# Patient Record
Sex: Male | Born: 1995 | Race: White | Hispanic: No | Marital: Single | Smoking: Former smoker
Health system: Southern US, Community
[De-identification: ages and names within clinical notes are randomized; demographics above are authoritative.]

## PROBLEM LIST (undated history)

## (undated) DIAGNOSIS — S82841A Displaced bimalleolar fracture of right lower leg, initial encounter for closed fracture: Secondary | ICD-10-CM

## (undated) DIAGNOSIS — I10 Essential (primary) hypertension: Secondary | ICD-10-CM

## (undated) DIAGNOSIS — T8859XA Other complications of anesthesia, initial encounter: Secondary | ICD-10-CM

## (undated) HISTORY — PX: NO PAST SURGERIES: SHX2092

---
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
## 2016-09-03 ENCOUNTER — Emergency Department
Admission: EM | Admit: 2016-09-03 | Discharge: 2016-09-03 | Disposition: A | Discharge status: Home / Self Care | Payer: BLUE CROSS/BLUE SHIELD | Attending: Emergency Medicine | Admitting: Emergency Medicine

## 2016-09-03 ENCOUNTER — Encounter: Payer: Self-pay | Admitting: Emergency Medicine

## 2016-09-03 DIAGNOSIS — W228XXA Striking against or struck by other objects, initial encounter: Secondary | ICD-10-CM | POA: Diagnosis not present

## 2016-09-03 DIAGNOSIS — Y929 Unspecified place or not applicable: Secondary | ICD-10-CM | POA: Insufficient documentation

## 2016-09-03 DIAGNOSIS — Y999 Unspecified external cause status: Secondary | ICD-10-CM | POA: Diagnosis not present

## 2016-09-03 DIAGNOSIS — Z23 Encounter for immunization: Secondary | ICD-10-CM | POA: Diagnosis not present

## 2016-09-03 DIAGNOSIS — F17200 Nicotine dependence, unspecified, uncomplicated: Secondary | ICD-10-CM | POA: Insufficient documentation

## 2016-09-03 DIAGNOSIS — Y9389 Activity, other specified: Secondary | ICD-10-CM | POA: Insufficient documentation

## 2016-09-03 DIAGNOSIS — S70352A Superficial foreign body, left thigh, initial encounter: Secondary | ICD-10-CM | POA: Insufficient documentation

## 2016-09-03 DIAGNOSIS — T148XXA Other injury of unspecified body region, initial encounter: Secondary | ICD-10-CM

## 2016-09-03 MED ORDER — CEPHALEXIN 500 MG PO CAPS: 500.0000 mg | ORAL_CAPSULE | Freq: Four times a day (QID) | ORAL | 0 refills | Status: AC

## 2016-09-03 MED ORDER — TETANUS-DIPHTH-ACELL PERTUSSIS 5-2.5-18.5 LF-MCG/0.5 IM SUSP
0.5000 mL | Freq: Once | INTRAMUSCULAR | Status: AC
  Administered 2016-09-03: 0.5 mL via INTRAMUSCULAR
  Filled 2016-09-03: qty 0.5

## 2016-09-03 MED ORDER — LIDOCAINE HCL (PF) 1 % IJ SOLN
INTRAMUSCULAR | Status: AC
  Filled 2016-09-03: qty 5

## 2016-09-03 MED ORDER — NAPROXEN 500 MG PO TBEC: 500.0000 mg | DELAYED_RELEASE_TABLET | Freq: Two times a day (BID) | ORAL | 0 refills | Status: AC

## 2016-09-03 MED ORDER — LIDOCAINE HCL 1 % IJ SOLN
5.0000 mL | Freq: Once | INTRAMUSCULAR | Status: DC
  Filled 2016-09-03: qty 5

## 2016-09-03 NOTE — ED Provider Notes (Signed)
[facility] Emergency Department Provider Note  ____________________________________________  Time seen: Approximately 3:29 PM  I have reviewed the triage vital signs and the nursing notes.   HISTORY  Chief Complaint Foreign Body in Skin    HPI Eric Durham is a 21 y.o. male with a foreign body consisting of sustained to the left anterior thigh. Patient states that he was helping a friend stack wood when he sustained a splinter. Patient did not hit his head or lose consciousness during the incident. He is unable to recall his last tetanus shot. He has been afebrile. He has noticed no streaking surrounding the foreign body. Patient denies chest pain, chest tightness, shortness of breath, abdominal pain, nausea and vomiting. No alleviating measures within attempted.    History reviewed. No pertinent past medical history.  There are no active problems to display for this patient.   History reviewed. No pertinent surgical history.  Prior to Admission medications   Medication Sig Start Date End Date Taking? Authorizing Provider  cephALEXin (KEFLEX) 500 MG capsule Take 1 capsule (500 mg total) by mouth 4 (four) times daily. 09/03/16 09/13/16  Orvil Feil, PA-C  naproxen (EC NAPROSYN) 500 MG EC tablet Take 1 tablet (500 mg total) by mouth 2 (two) times daily with a meal. 09/03/16 09/13/16  Orvil Feil, PA-C    Allergies Patient has no known allergies.  History reviewed. No pertinent family history.  Social History Social History  Substance Use Topics  . Smoking status: Current Every Day Smoker    Packs/day: 0.50  . Smokeless tobacco: Never Used  . Alcohol use No     Review of Systems  Constitutional: No fever/chills Eyes: No visual changes. No discharge ENT: No upper respiratory complaints. Cardiovascular: no chest pain. Respiratory: no cough. No SOB. Musculoskeletal: Negative for musculoskeletal pain. Skin: Patient has splinter of the skin  overlying left thigh. Neurological: Negative for headaches, focal weakness or numbness. ____________________________________________   PHYSICAL EXAM:  VITAL SIGNS: ED Triage Vitals  Enc Vitals Group     BP 09/03/16 1356 (!) 148/76     Pulse Rate 09/03/16 1356 63     Resp 09/03/16 1356 16     Temp 09/03/16 1356 97.8 F (36.6 C)     Temp src --      SpO2 09/03/16 1356 100 %     Weight 09/03/16 1357 149 lb 1 oz (67.6 kg)     Height 09/03/16 1357 6\' 1"  (1.854 m)     Head Circumference --      Peak Flow --      Pain Score 09/03/16 1359 3     Pain Loc --      Pain Edu? --      Excl. in GC? --    Constitutional: Alert and oriented. Well appearing and in no acute distress. Cardiovascular: Normal rate, regular rhythm. Normal S1 and S2.  Good peripheral circulation. Respiratory: Normal respiratory effort without tachypnea or retractions. Lungs CTAB. Good air entry to the bases with no decreased or absent breath sounds. Musculoskeletal: Full range of motion to all extremities. No gross deformities appreciated. Neurologic:  Normal speech and language. No gross focal neurologic deficits are appreciated.  Skin: Patient has splinter of the skin overlying the anterior left thigh. Psychiatric: Mood and affect are normal. Speech and behavior are normal. Patient exhibits appropriate insight and judgement. ____________________________________________   LABS (all labs ordered are listed, but only abnormal results are displayed)  Labs Reviewed -  No data to display ____________________________________________  EKG   ____________________________________________  RADIOLOGY   No results found.  ____________________________________________    PROCEDURES  Procedure(s) performed:    Procedures    Medications  lidocaine (XYLOCAINE) 1 % (with pres) injection 5 mL (not administered)  Tdap (BOOSTRIX) injection 0.5 mL (0.5 mLs Intramuscular Given 09/03/16 1555)   Foreign Body  Removal Performed by: Orvil FeilJaclyn M Edwardo Wojnarowski Consent: Verbal consent obtained. Risks and benefits: risks, benefits and alternatives were discussed Type: Splinter  Body area: Anterior Left Thigh   Anesthesia: local infiltration  Incision was made with a scalpel.  Local anesthetic: lidocaine 1% without epinephrine  Anesthetic total: 2 ml  Foreign Body: Trace Wood   Patient tolerance: Patient tolerated the procedure well with no immediate complications.  ____________________________________________   INITIAL IMPRESSION / ASSESSMENT AND PLAN / ED COURSE  Pertinent labs & imaging results that were available during my care of the patient were reviewed by me and considered in my medical decision making (see chart for details).  Review of the [place] CSRS was performed in accordance of the [facility] prior to dispensing any controlled drugs.     Assessment and Plan:  Splinter: Patient presents to the emergency department with a splinter of the skin overlying the left anterior thigh. Patient underwent splinter removal in the emergency department. Trace Wood material was extracted. Patient's tetanus status was updated. He was discharged with Keflex. All patient questions were answered. Vital signs are reassuring at this time. ____________________________________________  FINAL CLINICAL IMPRESSION(S) / ED DIAGNOSES  Final diagnoses:  Splinter      NEW MEDICATIONS STARTED DURING THIS VISIT:  New Prescriptions   CEPHALEXIN (KEFLEX) 500 MG CAPSULE    Take 1 capsule (500 mg total) by mouth 4 (four) times daily.   NAPROXEN (EC NAPROSYN) 500 MG EC TABLET    Take 1 tablet (500 mg total) by mouth 2 (two) times daily with a meal.        This chart was dictated using voice recognition software/Dragon. Despite best efforts to proofread, errors can occur which can change the meaning. Any change was purely unintentional.    Orvil FeilJaclyn M Sam Wunschel, PA-C 09/03/16 1556    Merrily BrittleNeil Rifenbark, MD 09/03/16  [phone]

## 2016-09-03 NOTE — ED Notes (Signed)
Splinter not visible but can be palpated under the skin approx 1" long

## 2016-09-03 NOTE — ED Triage Notes (Signed)
Pt was pulling a piece of wood across his leg and a long splinter went in that broke off as he attempted to take out.

## 2016-09-03 NOTE — ED Notes (Signed)
See triage note. He was pulling a piece of wood across left leg  Splinter went into left leg

## 2018-08-29 ENCOUNTER — Encounter (HOSPITAL_COMMUNITY): Payer: Self-pay

## 2018-08-29 ENCOUNTER — Other Ambulatory Visit: Payer: Self-pay

## 2018-08-29 ENCOUNTER — Emergency Department (HOSPITAL_COMMUNITY): Payer: BLUE CROSS/BLUE SHIELD

## 2018-08-29 ENCOUNTER — Emergency Department (HOSPITAL_COMMUNITY)
Admission: EM | Admit: 2018-08-29 | Discharge: 2018-08-29 | Disposition: A | Discharge status: Home / Self Care | Payer: BLUE CROSS/BLUE SHIELD | Attending: Emergency Medicine | Admitting: Emergency Medicine

## 2018-08-29 DIAGNOSIS — S6991XA Unspecified injury of right wrist, hand and finger(s), initial encounter: Secondary | ICD-10-CM | POA: Diagnosis present

## 2018-08-29 DIAGNOSIS — S6391XA Sprain of unspecified part of right wrist and hand, initial encounter: Secondary | ICD-10-CM | POA: Diagnosis not present

## 2018-08-29 DIAGNOSIS — F17210 Nicotine dependence, cigarettes, uncomplicated: Secondary | ICD-10-CM | POA: Insufficient documentation

## 2018-08-29 DIAGNOSIS — Y929 Unspecified place or not applicable: Secondary | ICD-10-CM | POA: Insufficient documentation

## 2018-08-29 DIAGNOSIS — Y999 Unspecified external cause status: Secondary | ICD-10-CM | POA: Diagnosis not present

## 2018-08-29 DIAGNOSIS — Y9351 Activity, roller skating (inline) and skateboarding: Secondary | ICD-10-CM | POA: Insufficient documentation

## 2018-08-29 DIAGNOSIS — W1789XA Other fall from one level to another, initial encounter: Secondary | ICD-10-CM | POA: Insufficient documentation

## 2018-08-29 DIAGNOSIS — S63501A Unspecified sprain of right wrist, initial encounter: Secondary | ICD-10-CM

## 2018-08-29 MED ORDER — IBUPROFEN 800 MG PO TABS: 800.0000 mg | ORAL_TABLET | Freq: Three times a day (TID) | ORAL | 0 refills | Status: DC

## 2018-08-29 NOTE — Discharge Instructions (Addendum)
Elevate and apply ice packs on and off to your wrist.  Wear the wrist brace as needed for support for at least 1 to 2 weeks.  Follow-up with 1 of the orthopedic providers listed if your symptoms are not improving.

## 2018-08-29 NOTE — ED Triage Notes (Signed)
Pt fell while skateboarding Saturday. Pt presents with pain to right hand and wrist today. Edema noted to right hand.

## 2018-08-29 NOTE — ED Provider Notes (Signed)
[facility] EMERGENCY DEPARTMENT Provider Note   CSN: [id number] Arrival date & time: 08/29/18  1255    History   Chief Complaint Chief Complaint  Patient presents with  . Wrist Pain    HPI Eric Durham is a 23 y.o. male.     HPI   Eric Durham is a 23 y.o. male who presents to the Emergency Department complaining of pain and swelling of his right lateral hand.  Symptoms began 2 days ago after a fall while riding a skateboard.  He is unsure of the mechanism of the fall.  He reports pain to his hand with movement of his fourth and fifth fingers.  Pain radiates to his wrist.  He denies open wound, numbness of his hand or fingers, elbow pain, head injury and neck pain.  No alleviating factors.    History reviewed. No pertinent past medical history.  There are no active problems to display for this patient.   History reviewed. No pertinent surgical history.    Home Medications    Prior to Admission medications   Not on File    Family History No family history on file.  Social History Social History   Tobacco Use  . Smoking status: Current Every Day Smoker    Packs/day: 0.50  . Smokeless tobacco: Never Used  Substance Use Topics  . Alcohol use: No  . Drug use: Not Currently     Allergies   Patient has no known allergies.   Review of Systems Review of Systems  Constitutional: Negative for chills and fever.  Musculoskeletal: Positive for arthralgias (Right hand pain and swelling) and joint swelling. Negative for neck pain.  Skin: Negative for color change and wound.  Neurological: Negative for dizziness, syncope and headaches.     Physical Exam Updated Vital Signs BP (!) 141/78 (BP Location: Right Arm)   Pulse 66   Temp 98.4 F (36.9 C) (Oral)   Resp 18   Ht 6\' 3"  (1.905 m)   Wt 72.6 kg   SpO2 100%   BMI 20.00 kg/m   Physical Exam Vitals signs and nursing note reviewed.  Constitutional:      General: He is not in acute  distress.    Appearance: He is well-developed.  HENT:     Head: Atraumatic.  Cardiovascular:     Rate and Rhythm: Normal rate and regular rhythm.     Pulses: Normal pulses.  Pulmonary:     Effort: Pulmonary effort is normal.     Breath sounds: Normal breath sounds.  Chest:     Chest wall: No tenderness.  Musculoskeletal:        General: Swelling, tenderness and signs of injury present. No deformity.     Comments: Focal tenderness to palpation of the dorsal aspect of the lateral right hand.  Mild to moderate edema noted.  Mild tenderness at the lateral wrist without bony deformity or edema of the wrist.  Proximal forearm and elbow are nontender.  Compartments are soft  Skin:    General: Skin is warm.     Capillary Refill: Capillary refill takes less than 2 seconds.     Findings: No rash.  Neurological:     General: No focal deficit present.     Mental Status: He is alert.     Sensory: No sensory deficit.     Motor: No weakness or abnormal muscle tone.      ED Treatments / Results  Labs (all labs ordered are listed,  but only abnormal results are displayed) Labs Reviewed - No data to display  EKG None  Radiology Dg Wrist Complete Right  Result Date: 08/29/2018 CLINICAL DATA:  Larey Seat while skateboarding. RIGHT hand and wrist pain. EXAM: RIGHT WRIST - COMPLETE 3+ VIEW; RIGHT HAND - COMPLETE 3+ VIEW COMPARISON:  None. FINDINGS: RIGHT hand: No acute fracture deformity or dislocation. No destructive bony lesions. Soft tissue planes are not suspicious. RIGHT wrist: No acute fracture deformity or dislocation. No destructive bony lesions. Dorsal wrist soft tissue swelling without subcutaneous gas or radiopaque foreign bodies. IMPRESSION: 1. Soft tissue swelling without acute osseous process. Electronically Signed   By: Awilda Metro M.D.   On: 08/29/2018 14:29   Dg Hand Complete Right  Result Date: 08/29/2018 CLINICAL DATA:  Larey Seat while skateboarding. RIGHT hand and wrist pain. EXAM:  RIGHT WRIST - COMPLETE 3+ VIEW; RIGHT HAND - COMPLETE 3+ VIEW COMPARISON:  None. FINDINGS: RIGHT hand: No acute fracture deformity or dislocation. No destructive bony lesions. Soft tissue planes are not suspicious. RIGHT wrist: No acute fracture deformity or dislocation. No destructive bony lesions. Dorsal wrist soft tissue swelling without subcutaneous gas or radiopaque foreign bodies. IMPRESSION: 1. Soft tissue swelling without acute osseous process. Electronically Signed   By: Awilda Metro M.D.   On: 08/29/2018 14:29    Procedures Procedures (including critical care time)  Medications Ordered in ED Medications - No data to display   Initial Impression / Assessment and Plan / ED Course  I have reviewed the triage vital signs and the nursing notes.  Pertinent labs & imaging results that were available during my care of the patient were reviewed by me and considered in my medical decision making (see chart for details).       Patient with right hand and wrist pain after mechanical fall.  Neurovascularly intact.  X-rays of the hand and wrist are negative for bony injury.  Injuries likely related to sprain.  Discussed possible ligamentous injury with patient.  He agrees to conservative treatment with Velcro splint of the wrist, elevation, ice, and NSAID therapy.  I have advised the need for orthopedic follow-up in 1 week if not improving.  Patient verbalized understanding.   Final Clinical Impressions(s) / ED Diagnoses   Final diagnoses:  Sprain of right wrist, initial encounter    ED Discharge Orders    None       Pauline Aus, PA-C 08/30/18 1419    Blane Ohara, MD 08/30/18 1623

## 2020-04-14 ENCOUNTER — Other Ambulatory Visit: Payer: Self-pay

## 2020-04-14 ENCOUNTER — Encounter (HOSPITAL_COMMUNITY): Payer: Self-pay

## 2020-04-14 ENCOUNTER — Emergency Department (HOSPITAL_COMMUNITY): Payer: Self-pay

## 2020-04-14 ENCOUNTER — Emergency Department (HOSPITAL_COMMUNITY)
Admission: EM | Admit: 2020-04-14 | Discharge: 2020-04-14 | Disposition: A | Discharge status: Home / Self Care | Payer: Self-pay | Attending: Emergency Medicine | Admitting: Emergency Medicine

## 2020-04-14 DIAGNOSIS — S8291XA Unspecified fracture of right lower leg, initial encounter for closed fracture: Secondary | ICD-10-CM | POA: Insufficient documentation

## 2020-04-14 DIAGNOSIS — Y9351 Activity, roller skating (inline) and skateboarding: Secondary | ICD-10-CM | POA: Insufficient documentation

## 2020-04-14 DIAGNOSIS — W19XXXA Unspecified fall, initial encounter: Secondary | ICD-10-CM

## 2020-04-14 DIAGNOSIS — Y92331 Roller skating rink as the place of occurrence of the external cause: Secondary | ICD-10-CM | POA: Insufficient documentation

## 2020-04-14 DIAGNOSIS — S82891A Other fracture of right lower leg, initial encounter for closed fracture: Secondary | ICD-10-CM

## 2020-04-14 DIAGNOSIS — Z87891 Personal history of nicotine dependence: Secondary | ICD-10-CM | POA: Insufficient documentation

## 2020-04-14 MED ORDER — KETOROLAC TROMETHAMINE 60 MG/2ML IM SOLN
60.0000 mg | Freq: Once | INTRAMUSCULAR | Status: AC
  Administered 2020-04-14: 60 mg via INTRAMUSCULAR
  Filled 2020-04-14: qty 2

## 2020-04-14 MED ORDER — IBUPROFEN 800 MG PO TABS: 800.0000 mg | ORAL_TABLET | Freq: Three times a day (TID) | ORAL | 0 refills | Status: DC

## 2020-04-14 NOTE — ED Notes (Signed)
Splint placed by ed tech and another RN, pt has positive sensation to touch in R foot, less than three sec cap refill.

## 2020-04-14 NOTE — ED Triage Notes (Signed)
Pt to er, pt states that he hurt his R ankle skate boarding yesterday, states that today it is more swollen and hurts more.

## 2020-04-14 NOTE — ED Provider Notes (Signed)
[facility] EMERGENCY DEPARTMENT Provider Note   CSN: [id number] Arrival date & time: 04/14/20  1051     History Chief Complaint  Patient presents with  . Ankle Pain    Eric Durham is a 24 y.o. male.  HPI 24 year old male with no significant medical history presents to the ER with right ankle pain.  Patient was skateboarding in a pool and has he went up the side of a pool fell onto his right ankle.  This happened yesterday, and he noted pain and worsening swelling this morning.  He is concerned that it might be broken.  He denies any numbness or tingling, no noticeable wounds to the leg.    History reviewed. No pertinent past medical history.  There are no problems to display for this patient.   History reviewed. No pertinent surgical history.     History reviewed. No pertinent family history.  Social History   Tobacco Use  . Smoking status: Former Smoker    Packs/day: 0.50    Types: Cigarettes    Quit date: 06/29/2017    Years since quitting: 2.7  . Smokeless tobacco: Never Used  Vaping Use  . Vaping Use: Every day  . Substances: Nicotine, Flavoring  Substance Use Topics  . Alcohol use: No  . Drug use: Not Currently    Home Medications Prior to Admission medications   Medication Sig Start Date End Date Taking? Authorizing Provider  ibuprofen (ADVIL) 800 MG tablet Take 1 tablet (800 mg total) by mouth 3 (three) times daily. 04/14/20   Mare Ferrari, PA-C    Allergies    Patient has no known allergies.  Review of Systems   Review of Systems  Constitutional: Negative for chills and fever.  Musculoskeletal: Positive for arthralgias and joint swelling.  Skin: Positive for color change. Negative for rash and wound.  Neurological: Negative for weakness and numbness.    Physical Exam Updated Vital Signs BP 124/72 (BP Location: Left Arm)   Pulse 92   Temp 98.2 F (36.8 C) (Oral)   Resp 18   Ht 6\' 3"  (1.905 m)   Wt 78.5 kg   SpO2 100%   BMI  21.62 kg/m   Physical Exam Vitals and nursing note reviewed.  Constitutional:      General: He is not in acute distress.    Appearance: Normal appearance. He is well-developed. He is not ill-appearing, toxic-appearing or diaphoretic.  HENT:     Head: Normocephalic and atraumatic.     Mouth/Throat:     Mouth: Mucous membranes are moist.     Pharynx: Oropharynx is clear.  Eyes:     Conjunctiva/sclera: Conjunctivae normal.  Cardiovascular:     Rate and Rhythm: Normal rate and regular rhythm.     Heart sounds: No murmur heard.   Pulmonary:     Effort: Pulmonary effort is normal. No respiratory distress.     Breath sounds: Normal breath sounds.  Abdominal:     Palpations: Abdomen is soft.     Tenderness: There is no abdominal tenderness.  Musculoskeletal:        General: Swelling, tenderness and signs of injury present. No deformity.     Cervical back: Normal range of motion and neck supple.     Right lower leg: Edema present.     Left lower leg: No edema.     Comments: Right ankle with significant edema and bruising. No evidence of open fractures. Gross sensation and strength intact. Limited ROM secondary to  pain   Skin:    General: Skin is warm and dry.     Capillary Refill: Capillary refill takes less than 2 seconds.     Findings: Bruising present. No erythema.  Neurological:     General: No focal deficit present.     Mental Status: He is alert and oriented to person, place, and time.  Psychiatric:        Mood and Affect: Mood normal.        Behavior: Behavior normal.     ED Results / Procedures / Treatments   Labs (all labs ordered are listed, but only abnormal results are displayed) Labs Reviewed - No data to display  EKG None  Radiology DG Ankle Right Port  Result Date: 04/14/2020 CLINICAL DATA:  Injury while skateboarding EXAM: PORTABLE RIGHT ANKLE - 3 VIEW COMPARISON:  None. FINDINGS: Frontal, oblique, and lateral views were obtained. There is a transversely  oriented fracture of the medial malleolus with inferior displacement of the avulsed medial malleolar fragment. There is a fracture, obliquely oriented, of the distal fibular diaphysis with slight posterior displacement distally. There is generalized soft tissue swelling. No appreciable joint effusion. No joint space narrowing or erosion. Ankle mortise appears grossly intact. IMPRESSION: Fracture of the medial malleolus, transversely oriented, with inferior displacement of the medial malleolus. There is also a fracture, obliquely oriented, of the distal fibula with slight posterior displacement distally. Soft tissue swelling noted. Ankle mortise appears grossly intact. No appreciable arthropathy. Electronically Signed   By: Bretta Bang III M.D.   On: 04/14/2020 11:47    Procedures Procedures (including critical care time)  Medications Ordered in ED Medications  ketorolac (TORADOL) injection 60 mg (60 mg Intramuscular Given 04/14/20 1320)    ED Course  I have reviewed the triage vital signs and the nursing notes.  Pertinent labs & imaging results that were available during my care of the patient were reviewed by me and considered in my medical decision making (see chart for details).    MDM Rules/Calculators/A&P                         24 year old male presents with right ankle bimalleolar fracture.  No evidence of open fracture, neurovascularly intact.  Patient was placed in a posterior and stirrup splint.  Toradol here for pain with crutches.  Spoke with Dr.Olin who recommends followup w/ Dr. Victorino Dike in office. Pt given followup instructions. Patient's report pain is not severe, will send home with 800mg  of ibuprofen. Return precautions discussed. He voiced understanding and is agreeable.   Final Clinical Impression(s) / ED Diagnoses Final diagnoses:  Fall  Closed fracture of right ankle, initial encounter    Rx / DC Orders ED Discharge Orders         Ordered    ibuprofen (ADVIL)  800 MG tablet  3 times daily        04/14/20 1315           04/16/20 04/14/20 1324    04/16/20, MD 04/14/20 1346

## 2020-04-14 NOTE — ED Notes (Signed)
Reviewed crutch use and splint care, pt verbalized understanding, pt states that he is ready to go home. Pt from dpt via wc, pt calling a friend to come get him

## 2020-04-14 NOTE — ED Notes (Signed)
Pt in bed, pt has swelling to R ankle, limb immobilized and elevated on pillow, ice pack placed,

## 2020-04-17 ENCOUNTER — Other Ambulatory Visit (HOSPITAL_COMMUNITY): Payer: Self-pay | Admitting: Orthopedic Surgery

## 2020-04-17 ENCOUNTER — Encounter (HOSPITAL_BASED_OUTPATIENT_CLINIC_OR_DEPARTMENT_OTHER): Payer: Self-pay | Admitting: Orthopedic Surgery

## 2020-04-17 ENCOUNTER — Other Ambulatory Visit: Payer: Self-pay

## 2020-04-17 ENCOUNTER — Other Ambulatory Visit (HOSPITAL_COMMUNITY)
Admission: RE | Admit: 2020-04-17 | Discharge: 2020-04-17 | Disposition: A | Discharge status: Home / Self Care | Payer: Self-pay | Source: Ambulatory Visit | Attending: Orthopedic Surgery | Admitting: Orthopedic Surgery

## 2020-04-17 DIAGNOSIS — Z01812 Encounter for preprocedural laboratory examination: Secondary | ICD-10-CM | POA: Insufficient documentation

## 2020-04-17 DIAGNOSIS — Z20822 Contact with and (suspected) exposure to covid-19: Secondary | ICD-10-CM | POA: Insufficient documentation

## 2020-04-17 LAB — SARS CORONAVIRUS 2 (TAT 6-24 HRS): SARS Coronavirus 2: NEGATIVE

## 2020-04-18 ENCOUNTER — Encounter (HOSPITAL_BASED_OUTPATIENT_CLINIC_OR_DEPARTMENT_OTHER): Payer: Self-pay | Admitting: Orthopedic Surgery

## 2020-04-18 ENCOUNTER — Other Ambulatory Visit: Payer: Self-pay

## 2020-04-18 ENCOUNTER — Encounter (HOSPITAL_BASED_OUTPATIENT_CLINIC_OR_DEPARTMENT_OTHER)
Admission: RE | Disposition: A | Discharge status: Home / Self Care | Payer: Self-pay | Source: Home / Self Care | Attending: Orthopedic Surgery

## 2020-04-18 ENCOUNTER — Ambulatory Visit (HOSPITAL_BASED_OUTPATIENT_CLINIC_OR_DEPARTMENT_OTHER)
Admission: RE | Admit: 2020-04-18 | Discharge: 2020-04-18 | Disposition: A | Discharge status: Home / Self Care | Payer: Self-pay | Attending: Orthopedic Surgery | Admitting: Orthopedic Surgery

## 2020-04-18 ENCOUNTER — Ambulatory Visit (HOSPITAL_BASED_OUTPATIENT_CLINIC_OR_DEPARTMENT_OTHER): Payer: Self-pay | Admitting: Anesthesiology

## 2020-04-18 DIAGNOSIS — S82851A Displaced trimalleolar fracture of right lower leg, initial encounter for closed fracture: Secondary | ICD-10-CM | POA: Insufficient documentation

## 2020-04-18 DIAGNOSIS — Y9351 Activity, roller skating (inline) and skateboarding: Secondary | ICD-10-CM | POA: Insufficient documentation

## 2020-04-18 HISTORY — DX: Displaced bimalleolar fracture of right lower leg, initial encounter for closed fracture: S82.841A

## 2020-04-18 HISTORY — PX: ORIF ANKLE FRACTURE: SHX5408

## 2020-04-18 HISTORY — DX: Other complications of anesthesia, initial encounter: T88.59XA

## 2020-04-18 SURGERY — OPEN REDUCTION INTERNAL FIXATION (ORIF) ANKLE FRACTURE
Anesthesia: General | Site: Ankle | Laterality: Right

## 2020-04-18 MED ORDER — MIDAZOLAM HCL 2 MG/2ML IJ SOLN
INTRAMUSCULAR | Status: AC
  Filled 2020-04-18: qty 2

## 2020-04-18 MED ORDER — LACTATED RINGERS IV SOLN: INTRAVENOUS | Status: DC

## 2020-04-18 MED ORDER — ROPIVACAINE HCL 5 MG/ML IJ SOLN
INTRAMUSCULAR | Status: DC | PRN
  Administered 2020-04-18: 50 mL via PERINEURAL

## 2020-04-18 MED ORDER — MIDAZOLAM HCL 2 MG/2ML IJ SOLN
INTRAMUSCULAR | Status: DC | PRN
  Administered 2020-04-18: 2 mg via INTRAVENOUS

## 2020-04-18 MED ORDER — PROPOFOL 10 MG/ML IV BOLUS
INTRAVENOUS | Status: DC | PRN
  Administered 2020-04-18: 300 mg via INTRAVENOUS

## 2020-04-18 MED ORDER — LIDOCAINE HCL (CARDIAC) PF 100 MG/5ML IV SOSY
PREFILLED_SYRINGE | INTRAVENOUS | Status: DC | PRN
  Administered 2020-04-18: 60 mg via INTRAVENOUS

## 2020-04-18 MED ORDER — LACTATED RINGERS IV SOLN: INTRAVENOUS | Status: DC | PRN

## 2020-04-18 MED ORDER — ONDANSETRON HCL 4 MG/2ML IJ SOLN
INTRAMUSCULAR | Status: AC
  Filled 2020-04-18: qty 2

## 2020-04-18 MED ORDER — OXYCODONE HCL 5 MG/5ML PO SOLN: 5.0000 mg | Freq: Once | ORAL | Status: DC | PRN

## 2020-04-18 MED ORDER — DEXAMETHASONE SODIUM PHOSPHATE 10 MG/ML IJ SOLN
INTRAMUSCULAR | Status: DC | PRN
  Administered 2020-04-18: 5 mg via INTRAVENOUS

## 2020-04-18 MED ORDER — PROPOFOL 10 MG/ML IV BOLUS
INTRAVENOUS | Status: AC
  Filled 2020-04-18: qty 40

## 2020-04-18 MED ORDER — HYDROMORPHONE HCL 1 MG/ML IJ SOLN: 0.2500 mg | INTRAMUSCULAR | Status: DC | PRN

## 2020-04-18 MED ORDER — FENTANYL CITRATE (PF) 100 MCG/2ML IJ SOLN
INTRAMUSCULAR | Status: AC
  Filled 2020-04-18: qty 2

## 2020-04-18 MED ORDER — MIDAZOLAM HCL 2 MG/2ML IJ SOLN
2.0000 mg | Freq: Once | INTRAMUSCULAR | Status: AC
  Administered 2020-04-18: 2 mg via INTRAVENOUS

## 2020-04-18 MED ORDER — SODIUM CHLORIDE 0.9 % IV SOLN: INTRAVENOUS | Status: DC

## 2020-04-18 MED ORDER — OXYCODONE HCL 5 MG PO TABS: 5.0000 mg | ORAL_TABLET | Freq: Once | ORAL | Status: DC | PRN

## 2020-04-18 MED ORDER — LIDOCAINE 2% (20 MG/ML) 5 ML SYRINGE
INTRAMUSCULAR | Status: AC
  Filled 2020-04-18: qty 5

## 2020-04-18 MED ORDER — PROMETHAZINE HCL 25 MG/ML IJ SOLN: 6.2500 mg | INTRAMUSCULAR | Status: DC | PRN

## 2020-04-18 MED ORDER — GLYCOPYRROLATE 0.2 MG/ML IJ SOLN
INTRAMUSCULAR | Status: DC | PRN
  Administered 2020-04-18: .2 mg via INTRAVENOUS

## 2020-04-18 MED ORDER — FENTANYL CITRATE (PF) 100 MCG/2ML IJ SOLN
100.0000 ug | Freq: Once | INTRAMUSCULAR | Status: AC
  Administered 2020-04-18: 100 ug via INTRAVENOUS

## 2020-04-18 MED ORDER — CEFAZOLIN SODIUM-DEXTROSE 2-3 GM-%(50ML) IV SOLR
INTRAVENOUS | Status: DC | PRN
  Administered 2020-04-18: 2 g via INTRAVENOUS

## 2020-04-18 MED ORDER — OXYCODONE HCL 5 MG PO TABS
5.0000 mg | ORAL_TABLET | Freq: Four times a day (QID) | ORAL | 0 refills | Status: AC | PRN
Start: 2020-04-18 — End: 2020-04-23

## 2020-04-18 MED ORDER — DEXMEDETOMIDINE HCL 200 MCG/2ML IV SOLN
INTRAVENOUS | Status: DC | PRN
  Administered 2020-04-18 (×2): 8 ug via INTRAVENOUS

## 2020-04-18 MED ORDER — AMISULPRIDE (ANTIEMETIC) 5 MG/2ML IV SOLN: 10.0000 mg | Freq: Once | INTRAVENOUS | Status: DC | PRN

## 2020-04-18 MED ORDER — ONDANSETRON HCL 4 MG/2ML IJ SOLN
INTRAMUSCULAR | Status: DC | PRN
  Administered 2020-04-18: 4 mg via INTRAVENOUS

## 2020-04-18 MED ORDER — MEPERIDINE HCL 25 MG/ML IJ SOLN: 6.2500 mg | INTRAMUSCULAR | Status: DC | PRN

## 2020-04-18 MED ORDER — VANCOMYCIN HCL 500 MG IV SOLR
INTRAVENOUS | Status: AC
  Filled 2020-04-18: qty 500

## 2020-04-18 MED ORDER — CEFAZOLIN SODIUM-DEXTROSE 2-4 GM/100ML-% IV SOLN: 2.0000 g | INTRAVENOUS | Status: DC

## 2020-04-18 MED ORDER — EPHEDRINE SULFATE 50 MG/ML IJ SOLN
INTRAMUSCULAR | Status: DC | PRN
  Administered 2020-04-18: 15 mg via INTRAVENOUS

## 2020-04-18 SURGICAL SUPPLY — 80 items
APL PRP STRL LF DISP 70% ISPRP (MISCELLANEOUS) ×1
BANDAGE ESMARK 6X9 LF (GAUZE/BANDAGES/DRESSINGS) IMPLANT
BIT DRILL 2.5X2.75 QC CALB (BIT) ×2 IMPLANT
BIT DRILL 2.9 CANN QC NONSTRL (BIT) ×2 IMPLANT
BIT DRILL 3.5X5.5 QC CALB (BIT) ×2 IMPLANT
BLADE SURG 15 STRL LF DISP TIS (BLADE) ×2 IMPLANT
BLADE SURG 15 STRL SS (BLADE) ×6
BNDG CMPR 9X4 STRL LF SNTH (GAUZE/BANDAGES/DRESSINGS)
BNDG CMPR 9X6 STRL LF SNTH (GAUZE/BANDAGES/DRESSINGS)
BNDG COHESIVE 4X5 TAN STRL (GAUZE/BANDAGES/DRESSINGS) ×3 IMPLANT
BNDG COHESIVE 6X5 TAN STRL LF (GAUZE/BANDAGES/DRESSINGS) ×3 IMPLANT
BNDG ESMARK 4X9 LF (GAUZE/BANDAGES/DRESSINGS) IMPLANT
BNDG ESMARK 6X9 LF (GAUZE/BANDAGES/DRESSINGS)
CANISTER SUCT 1200ML W/VALVE (MISCELLANEOUS) ×3 IMPLANT
CHLORAPREP W/TINT 26 (MISCELLANEOUS) ×3 IMPLANT
COVER BACK TABLE 60X90IN (DRAPES) ×3 IMPLANT
COVER WAND RF STERILE (DRAPES) IMPLANT
CUFF TOURN SGL QUICK 34 (TOURNIQUET CUFF)
CUFF TRNQT CYL 34X4.125X (TOURNIQUET CUFF) IMPLANT
DECANTER SPIKE VIAL GLASS SM (MISCELLANEOUS) IMPLANT
DRAPE EXTREMITY T 121X128X90 (DISPOSABLE) ×3 IMPLANT
DRAPE OEC MINIVIEW 54X84 (DRAPES) ×3 IMPLANT
DRAPE U-SHAPE 47X51 STRL (DRAPES) ×3 IMPLANT
DRSG MEPITEL 4X7.2 (GAUZE/BANDAGES/DRESSINGS) ×3 IMPLANT
DRSG PAD ABDOMINAL 8X10 ST (GAUZE/BANDAGES/DRESSINGS) ×6 IMPLANT
ELECT REM PT RETURN 9FT ADLT (ELECTROSURGICAL) ×3
ELECTRODE REM PT RTRN 9FT ADLT (ELECTROSURGICAL) ×1 IMPLANT
GAUZE SPONGE 4X4 12PLY STRL (GAUZE/BANDAGES/DRESSINGS) ×3 IMPLANT
GLOVE BIO SURGEON STRL SZ8 (GLOVE) ×3 IMPLANT
GLOVE BIOGEL PI IND STRL 8 (GLOVE) ×2 IMPLANT
GLOVE BIOGEL PI INDICATOR 8 (GLOVE) ×4
GLOVE ECLIPSE 8.0 STRL XLNG CF (GLOVE) ×3 IMPLANT
GOWN STRL REUS W/ TWL LRG LVL3 (GOWN DISPOSABLE) ×1 IMPLANT
GOWN STRL REUS W/ TWL XL LVL3 (GOWN DISPOSABLE) ×2 IMPLANT
GOWN STRL REUS W/TWL LRG LVL3 (GOWN DISPOSABLE) ×3
GOWN STRL REUS W/TWL XL LVL3 (GOWN DISPOSABLE) ×6
K-WIRE ACE 1.6X6 (WIRE) ×6
KWIRE ACE 1.6X6 (WIRE) IMPLANT
NEEDLE HYPO 22GX1.5 SAFETY (NEEDLE) IMPLANT
NS IRRIG 1000ML POUR BTL (IV SOLUTION) ×3 IMPLANT
PACK BASIN DAY SURGERY FS (CUSTOM PROCEDURE TRAY) ×3 IMPLANT
PAD CAST 4YDX4 CTTN HI CHSV (CAST SUPPLIES) ×1 IMPLANT
PADDING CAST ABS 4INX4YD NS (CAST SUPPLIES)
PADDING CAST ABS COTTON 4X4 ST (CAST SUPPLIES) IMPLANT
PADDING CAST COTTON 4X4 STRL (CAST SUPPLIES) ×3
PADDING CAST COTTON 6X4 STRL (CAST SUPPLIES) ×3 IMPLANT
PENCIL SMOKE EVACUATOR (MISCELLANEOUS) ×3 IMPLANT
PLATE ACE 100DEG 7HOLE (Plate) ×2 IMPLANT
SANITIZER HAND PURELL 535ML FO (MISCELLANEOUS) ×3 IMPLANT
SCREW ACE CAN 4.0 40M (Screw) ×2 IMPLANT
SCREW CORTICAL 3.5MM  12MM (Screw) ×3 IMPLANT
SCREW CORTICAL 3.5MM  16MM (Screw) ×6 IMPLANT
SCREW CORTICAL 3.5MM  30MM (Screw) ×3 IMPLANT
SCREW CORTICAL 3.5MM 12MM (Screw) IMPLANT
SCREW CORTICAL 3.5MM 14MM (Screw) ×2 IMPLANT
SCREW CORTICAL 3.5MM 16MM (Screw) IMPLANT
SCREW CORTICAL 3.5MM 18MM (Screw) ×4 IMPLANT
SCREW CORTICAL 3.5MM 30MM (Screw) IMPLANT
SHEET MEDIUM DRAPE 40X70 STRL (DRAPES) ×3 IMPLANT
SLEEVE SCD COMPRESS KNEE MED (MISCELLANEOUS) ×3 IMPLANT
SPLINT FAST PLASTER 5X30 (CAST SUPPLIES) ×40
SPLINT PLASTER CAST FAST 5X30 (CAST SUPPLIES) ×20 IMPLANT
SPONGE LAP 18X18 RF (DISPOSABLE) ×3 IMPLANT
STOCKINETTE 6  STRL (DRAPES) ×3
STOCKINETTE 6 STRL (DRAPES) ×1 IMPLANT
SUCTION FRAZIER HANDLE 10FR (MISCELLANEOUS) ×3
SUCTION TUBE FRAZIER 10FR DISP (MISCELLANEOUS) ×1 IMPLANT
SUT ETHILON 3 0 PS 1 (SUTURE) ×3 IMPLANT
SUT FIBERWIRE #2 38 T-5 BLUE (SUTURE)
SUT MNCRL AB 3-0 PS2 18 (SUTURE) IMPLANT
SUT VIC AB 0 SH 27 (SUTURE) IMPLANT
SUT VIC AB 2-0 SH 27 (SUTURE) ×3
SUT VIC AB 2-0 SH 27XBRD (SUTURE) ×1 IMPLANT
SUTURE FIBERWR #2 38 T-5 BLUE (SUTURE) IMPLANT
SYR BULB EAR ULCER 3OZ GRN STR (SYRINGE) ×3 IMPLANT
SYR CONTROL 10ML LL (SYRINGE) IMPLANT
TOWEL GREEN STERILE FF (TOWEL DISPOSABLE) ×6 IMPLANT
TUBE CONNECTING 20'X1/4 (TUBING) ×1
TUBE CONNECTING 20X1/4 (TUBING) ×2 IMPLANT
UNDERPAD 30X36 HEAVY ABSORB (UNDERPADS AND DIAPERS) ×3 IMPLANT

## 2020-04-18 NOTE — Anesthesia Procedure Notes (Signed)
Procedure Name: LMA Insertion Performed by: Keiran Sias M, CRNA Pre-anesthesia Checklist: Patient identified, Emergency Drugs available, Suction available and Patient being monitored Patient Re-evaluated:Patient Re-evaluated prior to induction Oxygen Delivery Method: Circle system utilized Preoxygenation: Pre-oxygenation with 100% oxygen Induction Type: IV induction Ventilation: Mask ventilation without difficulty LMA: LMA inserted LMA Size: 4.0 Number of attempts: 1 Airway Equipment and Method: Bite block Placement Confirmation: positive ETCO2 Tube secured with: Tape Dental Injury: Teeth and Oropharynx as per pre-operative assessment        

## 2020-04-18 NOTE — Anesthesia Procedure Notes (Signed)
Anesthesia Regional Block: Adductor canal block   Pre-Anesthetic Checklist: ,, timeout performed, Correct Patient, Correct Site, Correct Laterality, Correct Procedure, Correct Position, site marked, Risks and benefits discussed,  Surgical consent,  Pre-op evaluation,  At surgeon's request and post-op pain management  Laterality: Right  Prep: chloraprep       Needles:  Injection technique: Single-shot  Needle Type: Stimiplex     Needle Length: 9cm  Needle Gauge: 21     Additional Needles:   Procedures:,,,, ultrasound used (permanent image in chart),,,,  Narrative:  Start time: 04/18/2020 12:37 PM End time: 04/18/2020 12:42 PM Injection made incrementally with aspirations every 5 mL.  Performed by: Personally  Anesthesiologist: Lowella Curb, MD

## 2020-04-18 NOTE — Op Note (Signed)
04/18/2020  3:08 PM  PATIENT:  Eric Durham  24 y.o. male  PRE-OPERATIVE DIAGNOSIS: Right ankle trimalleolar fracture  POST-OPERATIVE DIAGNOSIS: Same   Procedure(s):  1.  Open treatment of right ankle trimalleolar fracture with internal fixation without fixation of the posterior lip 2.  Stress examination of the right ankle under fluoroscopy 3.  AP, mortise and lateral radiographs of the right ankle  SURGEON:  Toni Arthurs, MD  ASSISTANT: None  ANESTHESIA:   General, regional  EBL:  minimal   TOURNIQUET:   Total Tourniquet Time Documented: Thigh (Right) - 44 minutes Total: Thigh (Right) - 44 minutes  COMPLICATIONS:  None apparent  DISPOSITION:  Extubated, awake and stable to recovery.  INDICATION FOR PROCEDURE: The patient is a 24 year old male without significant past medical history.  He was skateboarding on Saturday when he fell and broke his ankle.  Radiographs reveal a displaced trimalleolar fracture.  He presents now for operative treatment of this displaced and unstable right ankle fracture.  The risks and benefits of the alternative treatment options have been discussed in detail.  The patient wishes to proceed with surgery and specifically understands risks of bleeding, infection, nerve damage, blood clots, need for additional surgery, amputation and death.  PROCEDURE IN DETAIL:  After pre operative consent was obtained, and the correct operative site was identified, the patient was brought to the operating room and placed supine on the OR table.  Anesthesia was administered.  Pre-operative antibiotics were administered.  A surgical timeout was taken.  The right lower extremity was prepped and draped in standard sterile fashion with a tourniquet around the thigh.  The extremity was elevated and the tourniquet was inflated to 250 mmHg.  A longitudinal incision was made over the lateral malleolus.  Dissection was carried down through the subcutaneous tissues.  Fracture  was cleaned of all hematoma and irrigated.  The fracture was reduced and held with a lobster claw clamp.  A 3.5 mm fully threaded lag screw was inserted from anterior to posterior across the fracture site.  It was noted to compress the fracture site appropriately and have excellent purchase.  A 7 hole one third tubular plate from the [employer] titanium small frag set was then contoured to fit the lateral malleolus.  It was secured distally with 3 unicortical screws and proximally with 3 bicortical screws.  AP and lateral radiographs confirmed appropriate reduction of the lateral malleolus fracture in appropriate position and length of the screws.  The posterior malleolus fracture was noted to be appropriately reduced and did not involve any significant amount of the articular surface.  Attention was turned to the medial ankle where an incision was made over the medial malleolus.  Dissection was carried down through the subcutaneous tissues.  Fracture was cleaned of all hematoma and periosteum and was irrigated.  It was reduced and held with a tenaculum.  Radiographs confirmed appropriate reduction of the fracture.  K wires were placed across the fracture site.  The anterior wire was drilled and a 4 mm x 40 mm partially-threaded cannulated screw was inserted.  This compressed the fracture site appropriately.  The fracture was not large enough to hold 2 screws.  The posterior K wire was bent and trimmed.  It was impacted into position to prevent rotation of the fragment.  AP, mortise and lateral radiographs of the ankle confirmed appropriate position and length of all hardware and appropriate reduction of the fractures.  Stress examination was then performed with no evidence  of widening of the medial clear space or tib-fib overlap.  The wounds were irrigated copiously.  Vancomycin powder were sprinkled in the wounds.  They were then closed with Vicryl and nylon.  Sterile dressings were applied followed by a  well-padded short leg splint.  Tourniquet was released after application of the dressings.  The patient was awakened from anesthesia and transported to the recovery room in stable condition.   FOLLOW UP PLAN: Nonweightbearing on the right lower extremity.  Follow-up in the office in 2 weeks for suture removal and conversion to a cam boot to begin early range of motion.  We will plan nonweightbearing through 4 weeks postop and then a cam boot for a month after that.  Aspirin for DVT prophylaxis.   RADIOGRAPHS: AP, mortise and lateral radiographs of the right ankle are obtained intraoperatively.  These show interval reduction and fixation of the medial and lateral malleolus fractures.  Posterior malleolus fracture is appropriately reduced.  No other acute injuries are noted.

## 2020-04-18 NOTE — H&P (Signed)
Eric Durham is an 24 y.o. male.   Chief Complaint: right ankle injury HPI: The patient is a 24 year old male without significant past medical history.  He injured his right ankle skateboarding on Saturday.  Radiographs reveal a trimalleolar displaced fracture.  He presents now for operative treatment of this displaced and unstable right ankle injury.  Past Medical History:  Diagnosis Date  . Bimalleolar fracture of right ankle   . Complication of anesthesia    pt has never had surgery    Past Surgical History:  Procedure Laterality Date  . NO PAST SURGERIES      History reviewed. No pertinent family history. Social History:  reports that he quit smoking about 2 years ago. His smoking use included cigarettes. He smoked 0.50 packs per day. He has never used smokeless tobacco. He reports current alcohol use. He reports that he does not use drugs.  Allergies: No Known Allergies  Medications Prior to Admission  Medication Sig Dispense Refill  . ibuprofen (ADVIL) 800 MG tablet Take 1 tablet (800 mg total) by mouth 3 (three) times daily. 21 tablet 0    Results for orders placed or performed during the hospital encounter of 04/17/20 (from the past 48 hour(s))  SARS CORONAVIRUS 2 (TAT 6-24 HRS) Nasopharyngeal Nasopharyngeal Swab     Status: None   Collection Time: 04/17/20  9:45 AM   Specimen: Nasopharyngeal Swab  Result Value Ref Range   SARS Coronavirus 2 NEGATIVE NEGATIVE    Comment: (NOTE) SARS-CoV-2 target nucleic acids are NOT DETECTED.  The SARS-CoV-2 RNA is generally detectable in upper and lower respiratory specimens during the acute phase of infection. Negative results do not preclude SARS-CoV-2 infection, do not rule out co-infections with other pathogens, and should not be used as the sole basis for treatment or other patient management decisions. Negative results must be combined with clinical observations, patient history, and epidemiological information. The  expected result is Negative.  Fact Sheet for Patients: HairSlick.no  Fact Sheet for Healthcare Providers: quierodirigir.com  This test is not yet approved or cleared by the [place] FDA and  has been authorized for detection and/or diagnosis of SARS-CoV-2 by FDA under an Emergency Use Authorization (EUA). This EUA will remain  in effect (meaning this test can be used) for the duration of the COVID-19 declaration under Se ction 564(b)(1) of the Act, 21 U.S.C. section 360bbb-3(b)(1), unless the authorization is terminated or revoked sooner.  Performed at [facility], [address], [place], [place] [address]    No results found.  Review of Systems no recent fever, chills, nausea, vomiting or changes in his appetite  Blood pressure 126/75, pulse (!) 52, temperature 97.9 F (36.6 C), temperature source Oral, resp. rate (!) 8, height 6\' 3"  (1.905 m), weight 71.8 kg, SpO2 100 %. Physical Exam  Well-nourished well-developed male in no apparent distress.  Alert and oriented x4.  Normal mood and affect.  Gait is nonweightbearing on the right.  The right ankle has moderate swelling but otherwise healthy skin.  Pulses are palpable in the foot.  Intact sensibility to light touch dorsally and plantarly at the forefoot.  5 out of 5 strength in plantarflexion and dorsiflexion of the ankle and toes.   Assessment/Plan Displaced right ankle trimalleolar fracture - to the operating room for open treatment with internal fixation.  The risks and benefits of the alternative treatment options have been discussed in detail.  The patient wishes to proceed with surgery and specifically understands  risks of bleeding, infection, nerve damage, blood clots, need for additional surgery, amputation and death.   Toni Arthurs, MD May 06, 2020, 1:18 PM

## 2020-04-18 NOTE — Discharge Instructions (Addendum)
Post Anesthesia Home Care Instructions  Activity: Get plenty of rest for the remainder of the day. A responsible individual must stay with you for 24 hours following the procedure.  For the next 24 hours, DO NOT: -Drive a car -Advertising copywriter -Drink alcoholic beverages -Take any medication unless instructed by your physician -Make any legal decisions or sign important papers.  Meals: Start with liquid foods such as gelatin or soup. Progress to regular foods as tolerated. Avoid greasy, spicy, heavy foods. If nausea and/or vomiting occur, drink only clear liquids until the nausea and/or vomiting subsides. Call your physician if vomiting continues.  Special Instructions/Symptoms: Your throat may feel dry or sore from the anesthesia or the breathing tube placed in your throat during surgery. If this causes discomfort, gargle with warm salt water. The discomfort should disappear within 24 hours.  If you had a scopolamine patch placed behind your ear for the management of post- operative nausea and/or vomiting:  1. The medication in the patch is effective for 72 hours, after which it should be removed.  Wrap patch in a tissue and discard in the trash. Wash hands thoroughly with soap and water. 2. You may remove the patch earlier than 72 hours if you experience unpleasant side effects which may include dry mouth, dizziness or visual disturbances. 3. Avoid touching the patch. Wash your hands with soap and water after contact with the patch.       Regional Anesthesia Blocks  1. Numbness or the inability to move the "blocked" extremity may last from 3-48 hours after placement. The length of time depends on the medication injected and your individual response to the medication. If the numbness is not going away after 48 hours, call your surgeon.  2. The extremity that is blocked will need to be protected until the numbness is gone and the  Strength has returned. Because you cannot feel it, you  will need to take extra care to avoid injury. Because it may be weak, you may have difficulty moving it or using it. You may not know what position it is in without looking at it while the block is in effect.  3. For blocks in the legs and feet, returning to weight bearing and walking needs to be done carefully. You will need to wait until the numbness is entirely gone and the strength has returned. You should be able to move your leg and foot normally before you try and bear weight or walk. You will need someone to be with you when you first try to ensure you do not fall and possibly risk injury.  4. Bruising and tenderness at the needle site are common side effects and will resolve in a few days.  5. Persistent numbness or new problems with movement should be communicated to the surgeon or the [facility] [phone] [facility] [phone]).   Toni Arthurs, MD [facility]  Please read the following information regarding your care after surgery.  Medications  You only need a prescription for the narcotic pain medicine (ex. oxycodone, Percocet, Norco).  All of the other medicines listed below are available over the counter. X ibuprofen 800 mg three times a day for the first 3 days after surgery. X acetominophen (Tylenol) 650 mg every 4-6 hours as you need for minor to moderate pain X oxycodone as prescribed for severe pain  Narcotic pain medicine (ex. oxycodone, Percocet, Vicodin) will cause constipation.  To prevent this problem, take the following medicines while  you are taking any pain medicine. X docusate sodium (Colace) 100 mg twice a day X senna (Senokot) 2 tablets twice a day  X To help prevent blood clots, take a baby aspirin (81 mg) twice a day for two weeks after surgery.  You should also get up every hour while you are awake to move around.    Weight Bearing ? Bear weight when you are able on your operated leg or foot. ? Bear weight only on your  operated foot in the post-op shoe. X Do not bear any weight on the operated leg or foot.  Cast / Splint / Dressing X Keep your splint, cast or dressing clean and dry.  Don't put anything (coat hanger, pencil, etc) down inside of it.  If it gets damp, use a hair dryer on the cool setting to dry it.  If it gets soaked, call the office to schedule an appointment for a cast change. ? Remove your dressing 3 days after surgery and cover the incisions with dry dressings.    After your dressing, cast or splint is removed; you may shower, but do not soak or scrub the wound.  Allow the water to run over it, and then gently pat it dry.  Swelling It is normal for you to have swelling where you had surgery.  To reduce swelling and pain, keep your toes above your nose for at least 3 days after surgery.  It may be necessary to keep your foot or leg elevated for several weeks.  If it hurts, it should be elevated.  Follow Up Call my office at [phone] when you are discharged from the hospital or surgery center to schedule an appointment to be seen two weeks after surgery.  Call my office at [phone] if you develop a fever >101.5 F, nausea, vomiting, bleeding from the surgical site or severe pain.     Post Anesthesia Home Care Instructions  Activity: Get plenty of rest for the remainder of the day. A responsible individual must stay with you for 24 hours following the procedure.  For the next 24 hours, DO NOT: -Drive a car -Advertising copywriter -Drink alcoholic beverages -Take any medication unless instructed by your physician -Make any legal decisions or sign important papers.  Meals: Start with liquid foods such as gelatin or soup. Progress to regular foods as tolerated. Avoid greasy, spicy, heavy foods. If nausea and/or vomiting occur, drink only clear liquids until the nausea and/or vomiting subsides. Call your physician if vomiting continues.  Special Instructions/Symptoms: Your throat may  feel dry or sore from the anesthesia or the breathing tube placed in your throat during surgery. If this causes discomfort, gargle with warm salt water. The discomfort should disappear within 24 hours.     Regional Anesthesia Blocks  1. Numbness or the inability to move the "blocked" extremity may last from 3-48 hours after placement. The length of time depends on the medication injected and your individual response to the medication. If the numbness is not going away after 48 hours, call your surgeon.  2. The extremity that is blocked will need to be protected until the numbness is gone and the  Strength has returned. Because you cannot feel it, you will need to take extra care to avoid injury. Because it may be weak, you may have difficulty moving it or using it. You may not know what position it is in without looking at it while the block is in effect.  3. For blocks in  the legs and feet, returning to weight bearing and walking needs to be done carefully. You will need to wait until the numbness is entirely gone and the strength has returned. You should be able to move your leg and foot normally before you try and bear weight or walk. You will need someone to be with you when you first try to ensure you do not fall and possibly risk injury.  4. Bruising and tenderness at the needle site are common side effects and will resolve in a few days.  5. Persistent numbness or new problems with movement should be communicated to the surgeon or the [facility] [phone] [facility] [phone]).

## 2020-04-18 NOTE — Anesthesia Preprocedure Evaluation (Signed)
Anesthesia Evaluation  Patient identified by MRN, date of birth, ID band Patient awake    Reviewed: Allergy & Precautions, NPO status , Patient's Chart, lab work & pertinent test results  Airway Mallampati: II  TM Distance: >3 FB Neck ROM: Full    Dental no notable dental hx.    Pulmonary Current Smoker and Patient abstained from smoking.,    Pulmonary exam normal breath sounds clear to auscultation       Cardiovascular negative cardio ROS Normal cardiovascular exam Rhythm:Regular Rate:Normal     Neuro/Psych negative neurological ROS  negative psych ROS   GI/Hepatic negative GI ROS, Neg liver ROS,   Endo/Other  negative endocrine ROS  Renal/GU negative Renal ROS  negative genitourinary   Musculoskeletal negative musculoskeletal ROS (+)   Abdominal   Peds negative pediatric ROS (+)  Hematology negative hematology ROS (+)   Anesthesia Other Findings   Reproductive/Obstetrics negative OB ROS                             Anesthesia Physical Anesthesia Plan  ASA: II  Anesthesia Plan: General   Post-op Pain Management:  Regional for Post-op pain   Induction: Intravenous  PONV Risk Score and Plan: 2 and Ondansetron, Midazolam and Treatment may vary due to age or medical condition  Airway Management Planned: LMA  Additional Equipment:   Intra-op Plan:   Post-operative Plan: Extubation in OR  Informed Consent: I have reviewed the patients History and Physical, chart, labs and discussed the procedure including the risks, benefits and alternatives for the proposed anesthesia with the patient or authorized representative who has indicated his/her understanding and acceptance.     Dental advisory given  Plan Discussed with: CRNA  Anesthesia Plan Comments:         Anesthesia Quick Evaluation

## 2020-04-18 NOTE — Anesthesia Procedure Notes (Signed)
Anesthesia Regional Block: Popliteal block   Pre-Anesthetic Checklist: ,, timeout performed, Correct Patient, Correct Site, Correct Laterality, Correct Procedure, Correct Position, site marked, Risks and benefits discussed,  Surgical consent,  Pre-op evaluation,  At surgeon's request and post-op pain management  Laterality: Right  Prep: chloraprep       Needles:  Injection technique: Single-shot  Needle Type: Stimiplex     Needle Length: 9cm  Needle Gauge: 21     Additional Needles:   Procedures:,,,, ultrasound used (permanent image in chart),,,,  Narrative:  Start time: 04/18/2020 12:38 PM End time: 04/18/2020 12:43 PM Injection made incrementally with aspirations every 5 mL.  Performed by: Personally  Anesthesiologist: Lowella Curb, MD

## 2020-04-18 NOTE — Progress Notes (Signed)
Assisted Dr. Miller with right, ultrasound guided, popliteal, adductor canal block. Side rails up, monitors on throughout procedure. See vital signs in flow sheet. Tolerated Procedure well. 

## 2020-04-18 NOTE — Transfer of Care (Signed)
Immediate Anesthesia Transfer of Care Note  Patient: Eric Durham  Procedure(s) Performed: OPEN REDUCTION INTERNAL FIXATION (ORIF) RIGHT BIMALLEOLAR ANKLE FRACTURE (Right Ankle)  Patient Location: PACU  Anesthesia Type:General and Regional  Level of Consciousness: awake, alert  and oriented  Airway & Oxygen Therapy: Patient Spontanous Breathing and Patient connected to face mask oxygen  Post-op Assessment: Report given to RN and Post -op Vital signs reviewed and stable  Post vital signs: Reviewed and stable  Last Vitals:  Vitals Value Taken Time  BP    Temp    Pulse 57 04/18/20 1457  Resp 14 04/18/20 1457  SpO2 100 % 04/18/20 1457  Vitals shown include unvalidated device data.  Last Pain:  Vitals:   04/18/20 1113  TempSrc: Oral  PainSc: 1       Patients Stated Pain Goal: 3 (04/18/20 1113)  Complications: No complications documented.

## 2020-04-19 ENCOUNTER — Encounter (HOSPITAL_BASED_OUTPATIENT_CLINIC_OR_DEPARTMENT_OTHER): Payer: Self-pay | Admitting: Orthopedic Surgery

## 2020-04-19 NOTE — Anesthesia Postprocedure Evaluation (Signed)
Anesthesia Post Note  Patient: Eric Durham  Procedure(s) Performed: OPEN REDUCTION INTERNAL FIXATION (ORIF) RIGHT BIMALLEOLAR ANKLE FRACTURE (Right Ankle)     Patient location during evaluation: PACU Anesthesia Type: General Level of consciousness: sedated and patient cooperative Pain management: pain level controlled Vital Signs Assessment: post-procedure vital signs reviewed and stable Respiratory status: spontaneous breathing Cardiovascular status: stable Anesthetic complications: no   No complications documented.  Last Vitals:  Vitals:   04/18/20 1515 04/18/20 1551  BP: 125/76 (!) 122/98  Pulse: 63 98  Resp: 14 20  Temp:  36.6 C  SpO2: 100% 98%    Last Pain:  Vitals:   04/18/20 1551  TempSrc: Oral  PainSc:                  Lewie Loron

## 2022-05-30 IMAGING — DX DG ANKLE PORT 2V*R*
3 series · 3 of 3 positions shown · non-contrast
Comparison: None.

CLINICAL DATA: Injury while skateboarding

EXAM:
PORTABLE RIGHT ANKLE - 3 VIEW

[ankle ap]
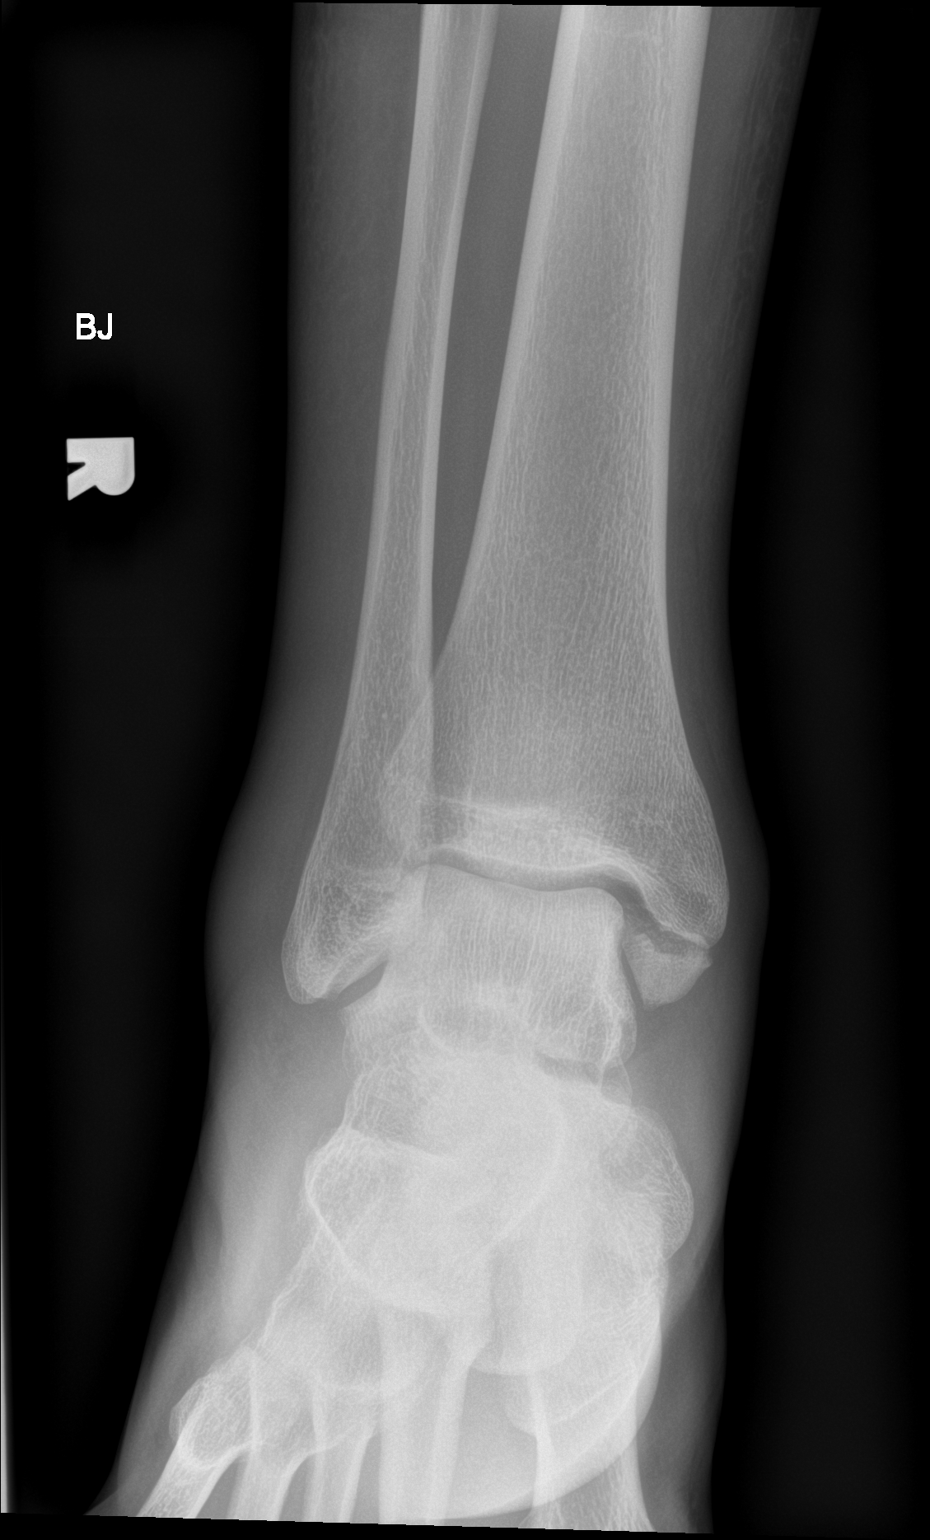

[ankle obl]
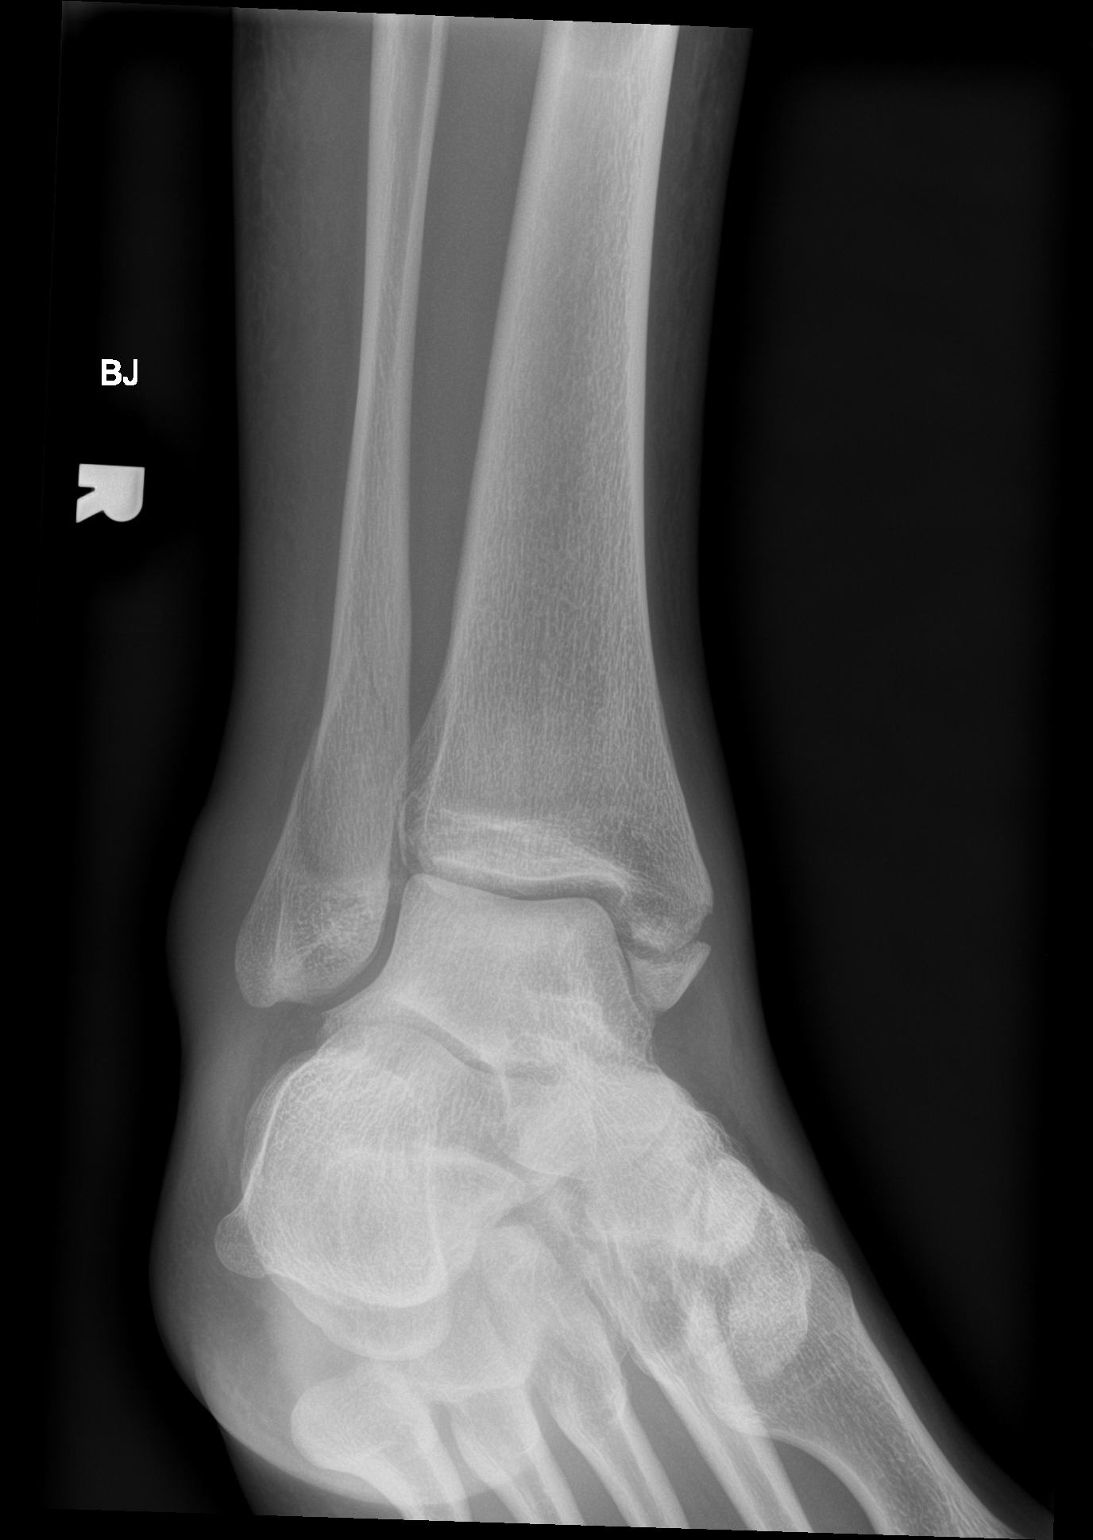

[ankle lat]
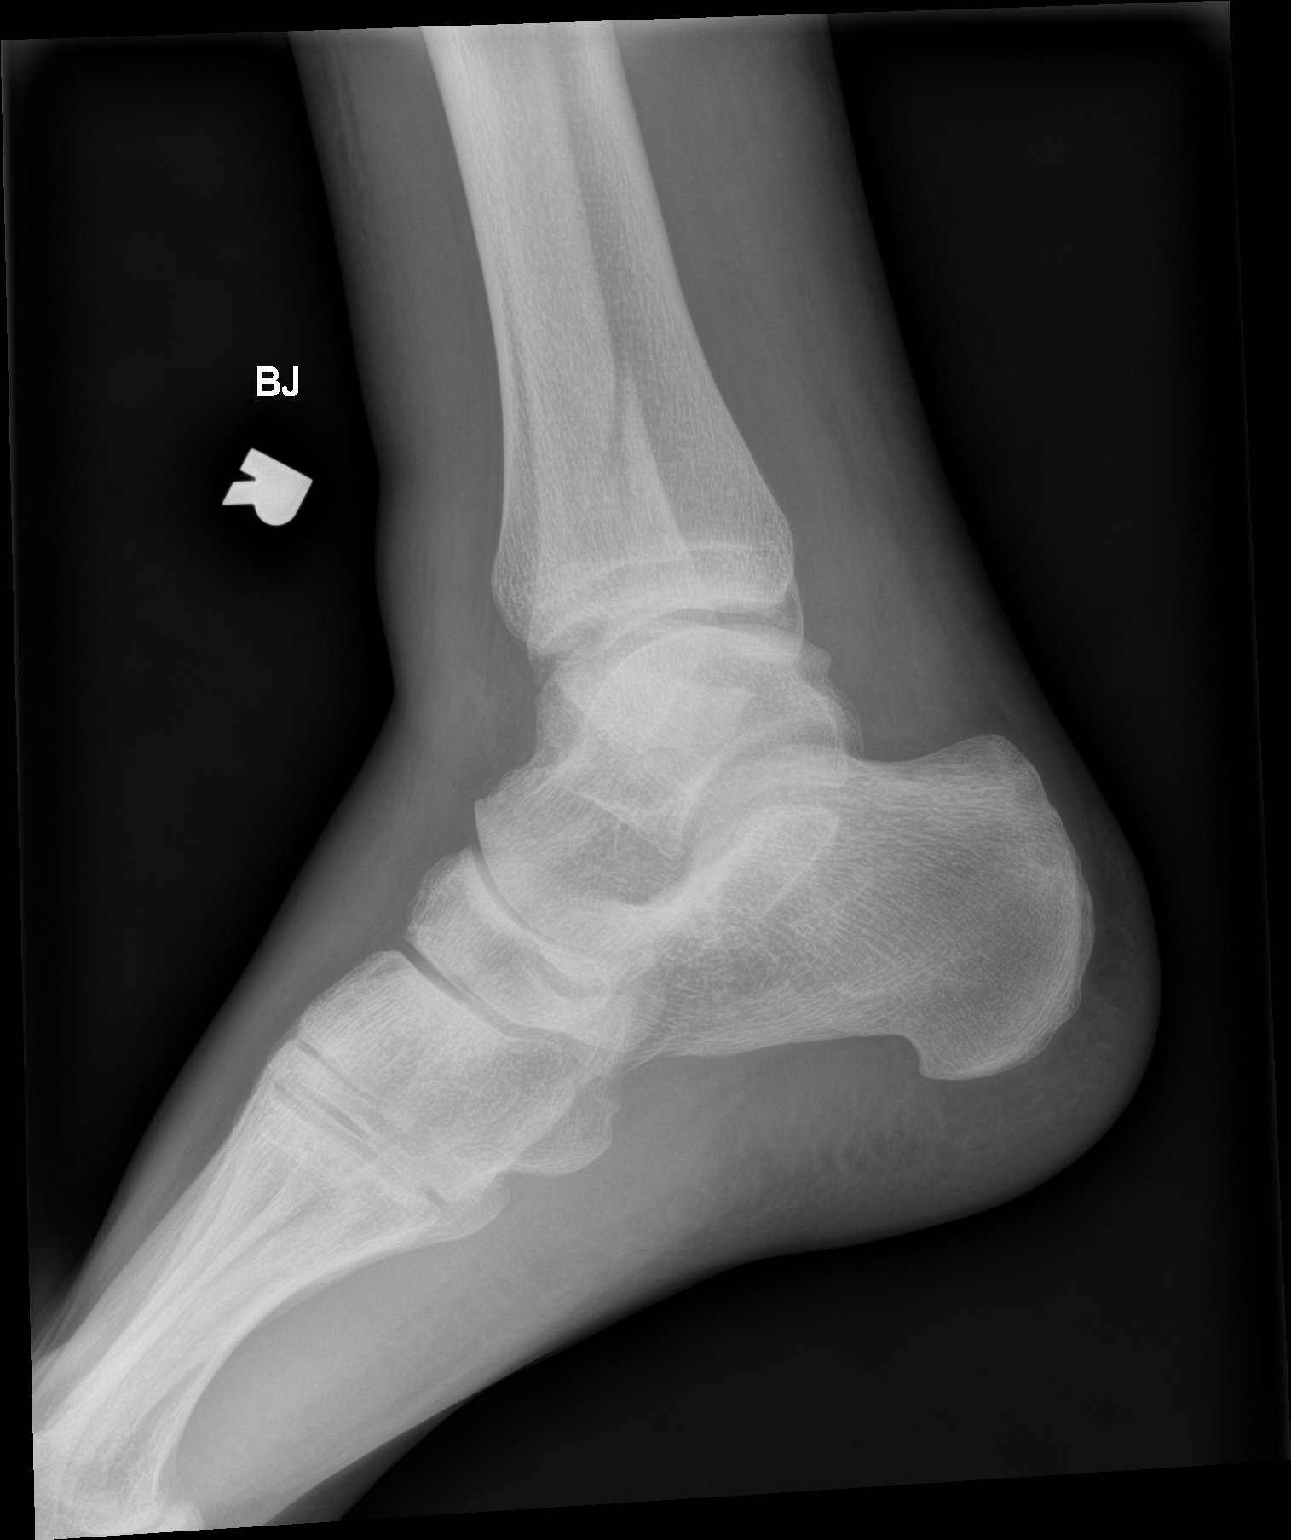

[3 of 3 positions shown; findings below may reference images not displayed]

FINDINGS: Frontal, oblique, and lateral views were obtained. There is a
transversely oriented fracture of the medial malleolus with inferior
displacement of the avulsed medial malleolar fragment. There is a
fracture, obliquely oriented, of the distal fibular diaphysis with
slight posterior displacement distally. There is generalized soft
tissue swelling. No appreciable joint effusion. No joint space
narrowing or erosion. Ankle mortise appears grossly intact.
IMPRESSION: Fracture of the medial malleolus, transversely oriented, with
inferior displacement of the medial malleolus. There is also a
fracture, obliquely oriented, of the distal fibula with slight
posterior displacement distally. Soft tissue swelling noted. Ankle
mortise appears grossly intact. No appreciable arthropathy.

## 2022-10-07 ENCOUNTER — Encounter: Payer: Self-pay | Admitting: *Deleted

## 2022-10-07 ENCOUNTER — Other Ambulatory Visit: Payer: Self-pay

## 2022-10-07 ENCOUNTER — Emergency Department
Admission: EM | Admit: 2022-10-07 | Discharge: 2022-10-07 | Disposition: A | Discharge status: Home / Self Care | Payer: Self-pay | Attending: Emergency Medicine | Admitting: Emergency Medicine

## 2022-10-07 ENCOUNTER — Emergency Department: Payer: Self-pay

## 2022-10-07 DIAGNOSIS — R079 Chest pain, unspecified: Secondary | ICD-10-CM | POA: Insufficient documentation

## 2022-10-07 LAB — CBC
HCT: 40.7 % (ref 39.0–52.0)
Hemoglobin: 13.6 g/dL (ref 13.0–17.0)
MCH: 29.2 pg (ref 26.0–34.0)
MCHC: 33.4 g/dL (ref 30.0–36.0)
MCV: 87.3 fL (ref 80.0–100.0)
Platelets: 247 10*3/uL (ref 150–400)
RBC: 4.66 MIL/uL (ref 4.22–5.81)
RDW: 12.5 % (ref 11.5–15.5)
WBC: 7.2 10*3/uL (ref 4.0–10.5)
nRBC: 0 % (ref 0.0–0.2)

## 2022-10-07 LAB — BASIC METABOLIC PANEL
Anion gap: 8 (ref 5–15)
BUN: 10 mg/dL (ref 6–20)
CO2: 24 mmol/L (ref 22–32)
Calcium: 9.5 mg/dL (ref 8.9–10.3)
Chloride: 107 mmol/L (ref 98–111)
Creatinine, Ser: 0.95 mg/dL (ref 0.61–1.24)
GFR, Estimated: >60 mL/min (ref >60)
Glucose, Bld: 114 mg/dL — ABNORMAL HIGH (ref 70–99)
Potassium: 3.6 mmol/L (ref 3.5–5.1)
Sodium: 139 mmol/L (ref 135–145)

## 2022-10-07 LAB — TROPONIN I (HIGH SENSITIVITY): Troponin I (High Sensitivity): <2 ng/L (ref <18)

## 2022-10-07 NOTE — Discharge Instructions (Signed)
You are seen in the emergency department for chest pain after vaping.  Your chest x-ray was normal.  Your heart enzyme was normal.  No concern for a blood clot or heart attack.  Follow-up closely with your primary care physician.  Do not vape.

## 2022-10-07 NOTE — ED Triage Notes (Signed)
Pt reports chest pain and sob.  Pt states pain started at 1500 after work.  Pt vaped and pain started.  Denies drug use today.  Pt alert  speech clear.

## 2022-10-07 NOTE — ED Provider Notes (Signed)
[facility] Provider Note    Event Date/Time   First MD Initiated Contact with Patient 10/07/22 2112     (approximate)   History   Chest Pain   HPI  Eric Durham is a 27 y.o. male presents to the emergency department with chest pain.  Endorses chest pain that started just after vaping CBD.  Sharp stabbing pain and felt dizzy and lightheaded.  Pain has improved since arriving to the emergency department.  Denies any significant shortness of breath.  No family history of coronary artery disease at a young age.  Denies any IV drug use.  Denies any nicotine use.  No history of DVT or PE.     Physical Exam   Triage Vital Signs: ED Triage Vitals [10/07/22 1953]  Enc Vitals Group     BP (!) 140/99     Pulse Rate 70     Resp 18     Temp 99.6 F (37.6 C)     Temp Source Oral     SpO2 100 %     Weight 155 lb (70.3 kg)     Height 6\' 3"  (1.905 m)     Head Circumference      Peak Flow      Pain Score 4     Pain Loc      Pain Edu?      Excl. in GC?     Most recent vital signs: Vitals:   10/07/22 1953  BP: (!) 140/99  Pulse: 70  Resp: 18  Temp: 99.6 F (37.6 C)  SpO2: 100%    Physical Exam Constitutional:      Appearance: He is well-developed.  HENT:     Head: Atraumatic.  Eyes:     Conjunctiva/sclera: Conjunctivae normal.  Cardiovascular:     Rate and Rhythm: Regular rhythm.     Heart sounds: Normal heart sounds.  Pulmonary:     Effort: No respiratory distress.  Musculoskeletal:     Cervical back: Normal range of motion.     Right lower leg: No edema.     Left lower leg: No edema.  Skin:    General: Skin is warm.  Neurological:     Mental Status: He is alert. Mental status is at baseline.     IMPRESSION / MDM / ASSESSMENT AND PLAN / ED COURSE  I reviewed the triage vital signs and the nursing notes.  Differential diagnosis including pneumomediastinum, pneumonia, ACS, anemia.  PERC negative, clinical picture is not  consistent with pulmonary embolism.  Clinical picture is not consistent with a dissection, no tearing chest pain. \F2 EKG  I, Corena Herter, the attending physician, personally viewed and interpreted this ECG.   Rate: Normal  Rhythm: Normal sinus  Axis: Normal  Intervals: Normal  ST&T Change: None  No tachycardic or bradycardic dysrhythmias while on cardiac telemetry.  RADIOLOGY I independently reviewed imaging, my interpretation of imaging: Chest x-ray no signs of pneumonia, no widened mediastinum.  Read as no acute findings.  LABS (all labs ordered are listed, but only abnormal results are displayed) Labs interpreted as -    Labs Reviewed  BASIC METABOLIC PANEL - Abnormal; Notable for the following components:      Result Value   Glucose, Bld 114 (*)    All other components within normal limits  CBC  TROPONIN I (HIGH SENSITIVITY)  TROPONIN I (HIGH SENSITIVITY)     MDM    Troponin is negative.  Low risk heart  score.  Do not feel that further troponins are necessary at this time.  Discussed not vaping with the patient.  States that he will follow-up with his primary care physician.  Discussed needing to follow-up for his elevated blood pressure.  Has been on propranolol in the past.  Given return precautions for any worsening symptoms   PROCEDURES:  Critical Care performed: No  Procedures  Patient's presentation is most consistent with acute presentation with potential threat to life or bodily function.   MEDICATIONS ORDERED IN ED: Medications - No data to display  FINAL CLINICAL IMPRESSION(S) / ED DIAGNOSES   Final diagnoses:  Chest pain, unspecified type     Rx / DC Orders   ED Discharge Orders     None        Note:  This document was prepared using Dragon voice recognition software and may include unintentional dictation errors.   Corena Herter, MD 10/07/22 2201

## 2023-05-20 ENCOUNTER — Other Ambulatory Visit: Payer: Self-pay

## 2023-05-20 ENCOUNTER — Emergency Department
Admission: EM | Admit: 2023-05-20 | Discharge: 2023-05-20 | Disposition: A | Discharge status: Home / Self Care | Payer: Self-pay | Attending: Emergency Medicine | Admitting: Emergency Medicine

## 2023-05-20 DIAGNOSIS — S61211A Laceration without foreign body of left index finger without damage to nail, initial encounter: Secondary | ICD-10-CM | POA: Insufficient documentation

## 2023-05-20 DIAGNOSIS — W272XXA Contact with scissors, initial encounter: Secondary | ICD-10-CM | POA: Insufficient documentation

## 2023-05-20 MED ORDER — LIDOCAINE-EPINEPHRINE-TETRACAINE (LET) TOPICAL GEL
3.0000 mL | Freq: Once | TOPICAL | Status: AC
  Administered 2023-05-20: 3 mL via TOPICAL
  Filled 2023-05-20: qty 3

## 2023-05-20 NOTE — ED Triage Notes (Signed)
Pt reports clipped the tip of his L pointer finger with scissors ~3pm. Presents with bloody bandage in place and bleeding controlled. New bandage applied in triage.

## 2023-05-20 NOTE — ED Notes (Signed)
Tourni-cot applied to index finger of left hand. Finger tip covered in LET.

## 2023-05-20 NOTE — ED Provider Notes (Signed)
[facility] Provider Note    Event Date/Time   First MD Initiated Contact with Patient 05/20/23 2204     (approximate)   History   Laceration   HPI Eric Durham is a 27 y.o. male presenting today for fingertip laceration.  Patient said he was working with scissors earlier when he cut the tip of his finger.  States that he cut off the skin but did not hit the bone.  Denies injury elsewhere.  Came in for ongoing bleeding.  Up-to-date on tetanus shot.     Physical Exam   Triage Vital Signs: ED Triage Vitals  Encounter Vitals Group     BP 05/20/23 2138 (!) 141/79     Systolic BP Percentile --      Diastolic BP Percentile --      Pulse Rate 05/20/23 2138 71     Resp 05/20/23 2138 18     Temp 05/20/23 2138 99.4 F (37.4 C)     Temp Source 05/20/23 2138 Oral     SpO2 05/20/23 2138 99 %     Weight 05/20/23 2137 160 lb (72.6 kg)     Height 05/20/23 2137 6\' 3"  (1.905 m)     Head Circumference --      Peak Flow --      Pain Score 05/20/23 2137 3     Pain Loc --      Pain Education --      Exclude from Growth Chart --     Most recent vital signs: Vitals:   05/20/23 2138  BP: (!) 141/79  Pulse: 71  Resp: 18  Temp: 99.4 F (37.4 C)  SpO2: 99%   I have reviewed the vital signs. General:  Awake, alert, no acute distress. Head:  Normocephalic, Atraumatic. EENT:  PERRL, EOMI, Oral mucosa pink and moist, Neck is supple. Cardiovascular: Regular rate, 2+ distal pulses. Respiratory:  Normal respiratory effort, symmetrical expansion, no distress.   Extremities:  Moving all four extremities through full ROM without pain.   Neuro:  Alert and oriented.  Interacting appropriately.   Skin:  Warm, dry, no rash.  Finger tip of left index finger has clean-cut of the skin removed.  No involvement of the bone.  No involvement of the nail. Psych: Appropriate affect.    ED Results / Procedures / Treatments   Labs (all labs ordered are listed, but only  abnormal results are displayed) Labs Reviewed - No data to display   EKG    RADIOLOGY    PROCEDURES:  Critical Care performed: No  ..Laceration Repair  Date/Time: 05/20/2023 11:12 PM  Performed by: Janith Lima, MD Authorized by: Janith Lima, MD   Consent:    Consent obtained:  Verbal   Consent given by:  Patient   Risks discussed:  Infection, pain, poor cosmetic result and poor wound healing   Alternatives discussed:  No treatment Universal protocol:    Patient identity confirmed:  Verbally with patient Anesthesia:    Anesthesia method:  Topical application   Topical anesthetic:  LET Laceration details:    Location:  Finger   Finger location:  L index finger   Length (cm):  1 (Essentially a clean abrasion with no laceration remarks) Exploration:    Hemostasis achieved with:  LET and tourniquet   Wound exploration: wound explored through full range of motion and entire depth of wound visualized     Contaminated: no   Treatment:    Area cleansed  with:  Chlorhexidine   Amount of cleaning:  Standard   Irrigation solution:  Sterile saline   Visualized foreign bodies/material removed: no     Debridement:  None   Undermining:  None Skin repair:    Repair method:  Tissue adhesive Repair type:    Repair type:  Simple Post-procedure details:    Dressing:  Non-adherent dressing and sterile dressing   Procedure completion:  Tolerated well, no immediate complications Comments:     Tourniquet and let were applied to stop the bleeding.  This was successful and Dermabond was placed over the entire abrasion area with success.  Nonadherent dressing was placed over top.    MEDICATIONS ORDERED IN ED: Medications  lidocaine-EPINEPHrine-tetracaine (LET) topical gel (3 mLs Topical Given 05/20/23 2243)     IMPRESSION / MDM / ASSESSMENT AND PLAN / ED COURSE  I reviewed the triage vital signs and the nursing notes.                              Differential diagnosis  includes, but is not limited to, finger laceration  Patient's presentation is most consistent with acute, uncomplicated illness.  Patient is a 27 year old male presenting today for laceration of the tip of his left index finger.  More like a clean cut of the distal skin without actual laceration spot that can be repaired with stitches.  Tourniquet and let were used to get hemostasis and then Dermabond was covered over the whole area to close it off.  Nonhearing dressing placed and patient safe for discharge.      FINAL CLINICAL IMPRESSION(S) / ED DIAGNOSES   Final diagnoses:  Laceration of left index finger without foreign body without damage to nail, initial encounter     Rx / DC Orders   ED Discharge Orders     None        Note:  This document was prepared using Dragon voice recognition software and may include unintentional dictation errors.   Janith Lima, MD 05/20/23 [phone]

## 2023-12-06 ENCOUNTER — Emergency Department
Admission: EM | Admit: 2023-12-06 | Discharge: 2023-12-06 | Disposition: A | Discharge status: Home / Self Care | Payer: Self-pay | Attending: Emergency Medicine | Admitting: Emergency Medicine

## 2023-12-06 ENCOUNTER — Other Ambulatory Visit: Payer: Self-pay

## 2023-12-06 DIAGNOSIS — K529 Noninfective gastroenteritis and colitis, unspecified: Secondary | ICD-10-CM | POA: Diagnosis not present

## 2023-12-06 DIAGNOSIS — R109 Unspecified abdominal pain: Secondary | ICD-10-CM | POA: Diagnosis not present

## 2023-12-06 DIAGNOSIS — D72829 Elevated white blood cell count, unspecified: Secondary | ICD-10-CM | POA: Diagnosis not present

## 2023-12-06 HISTORY — DX: Essential (primary) hypertension: I10

## 2023-12-06 LAB — LIPASE, BLOOD: Lipase: 48 U/L (ref 11–51)

## 2023-12-06 LAB — CBC
HCT: 45.4 % (ref 39.0–52.0)
Hemoglobin: 15.3 g/dL (ref 13.0–17.0)
MCH: 28.8 pg (ref 26.0–34.0)
MCHC: 33.7 g/dL (ref 30.0–36.0)
MCV: 85.5 fL (ref 80.0–100.0)
Platelets: 284 10*3/uL (ref 150–400)
RBC: 5.31 MIL/uL (ref 4.22–5.81)
RDW: 12.6 % (ref 11.5–15.5)
WBC: 12.6 10*3/uL — ABNORMAL HIGH (ref 4.0–10.5)
nRBC: 0 % (ref 0.0–0.2)

## 2023-12-06 LAB — COMPREHENSIVE METABOLIC PANEL WITH GFR
ALT: 21 U/L (ref 0–44)
AST: 29 U/L (ref 15–41)
Albumin: 5.7 g/dL — ABNORMAL HIGH (ref 3.5–5.0)
Alkaline Phosphatase: 69 U/L (ref 38–126)
Anion gap: 14 (ref 5–15)
BUN: 11 mg/dL (ref 6–20)
CO2: 18 mmol/L — ABNORMAL LOW (ref 22–32)
Calcium: 10 mg/dL (ref 8.9–10.3)
Chloride: 106 mmol/L (ref 98–111)
Creatinine, Ser: 0.81 mg/dL (ref 0.61–1.24)
GFR, Estimated: >60 mL/min (ref >60)
Glucose, Bld: 123 mg/dL — ABNORMAL HIGH (ref 70–99)
Potassium: 3.7 mmol/L (ref 3.5–5.1)
Sodium: 138 mmol/L (ref 135–145)
Total Bilirubin: 1.2 mg/dL (ref 0.0–1.2)
Total Protein: 8.7 g/dL — ABNORMAL HIGH (ref 6.5–8.1)

## 2023-12-06 MED ORDER — ONDANSETRON 4 MG PO TBDP: 4.0000 mg | ORAL_TABLET | Freq: Once | ORAL | Status: DC | PRN

## 2023-12-06 MED ORDER — DROPERIDOL 2.5 MG/ML IJ SOLN
1.2500 mg | Freq: Once | INTRAMUSCULAR | Status: AC
  Administered 2023-12-06: 1.25 mg via INTRAVENOUS
  Filled 2023-12-06: qty 2

## 2023-12-06 MED ORDER — ONDANSETRON HCL 4 MG/2ML IJ SOLN
4.0000 mg | Freq: Once | INTRAMUSCULAR | Status: AC
  Administered 2023-12-06: 4 mg via INTRAVENOUS
  Filled 2023-12-06: qty 2

## 2023-12-06 MED ORDER — ONDANSETRON HCL 4 MG PO TABS: 4.0000 mg | ORAL_TABLET | Freq: Three times a day (TID) | ORAL | 0 refills | Status: DC | PRN

## 2023-12-06 MED ORDER — KETOROLAC TROMETHAMINE 15 MG/ML IJ SOLN
15.0000 mg | Freq: Once | INTRAMUSCULAR | Status: AC
  Administered 2023-12-06: 15 mg via INTRAVENOUS
  Filled 2023-12-06: qty 1

## 2023-12-06 MED ORDER — SODIUM CHLORIDE 0.9 % IV BOLUS
1000.0000 mL | Freq: Once | INTRAVENOUS | Status: AC
  Administered 2023-12-06: 1000 mL via INTRAVENOUS

## 2023-12-06 NOTE — Discharge Instructions (Signed)
 I have sent nausea medication to your pharmacy.  I suspect this is likely a gastroenteritis causing your symptoms.  Please return if you have any severe sharp abdominal pain anywhere.  Otherwise return for any other acute or severe symptoms.

## 2023-12-06 NOTE — ED Triage Notes (Signed)
 Pt to ED POV for abdominal pain, diarrhea and vomiting since today. Whole abdomen. Has GB and appendix. Pt endorses >10 episodes of vomiting and diarrhea. Pt retching in triage and states all this vomiting making him lightheaded.

## 2023-12-06 NOTE — ED Provider Notes (Signed)
 [facility] Provider Note    Event Date/Time   First MD Initiated Contact with Patient 12/06/23 1938     (approximate)   History   Abdominal Pain, Emesis, and Diarrhea   HPI Eric Durham is a 28 y.o. male presenting today for abdominal pain, vomiting, and diarrhea.  Patient states earlier this morning he had onset of diarrhea symptoms.  This was then followed by vomiting and eventually abdominal pain.  Denies any specific locality of the abdominal pain and feels more like cramping.  No fevers, chills, body ache, cough, congestion, chest pain, dysuria.  Denies any alcohol use.  Occasional marijuana use.  No other drug use.  States he had a similar episode approximately 1 year ago with no obvious cause as well.  Chart review: Patient was seen and April 2024 for similar symptoms which ended up being gastroenteritis     Physical Exam   Triage Vital Signs: ED Triage Vitals  Encounter Vitals Group     BP 12/06/23 1842 (!) 132/111     Systolic BP Percentile --      Diastolic BP Percentile --      Pulse Rate 12/06/23 1843 94     Resp 12/06/23 1842 20     Temp 12/06/23 1842 97.7 F (36.5 C)     Temp Source 12/06/23 1842 Oral     SpO2 12/06/23 1842 100 %     Weight 12/06/23 1839 160 lb (72.6 kg)     Height 12/06/23 1839 6\' 2"  (1.88 m)     Head Circumference --      Peak Flow --      Pain Score 12/06/23 1839 6     Pain Loc --      Pain Education --      Exclude from Growth Chart --     Most recent vital signs: Vitals:   12/06/23 1842 12/06/23 1843  BP: (!) 132/111   Pulse:  94  Resp: 20   Temp: 97.7 F (36.5 C)   SpO2: 100%    Physical Exam: I have reviewed the vital signs and nursing notes. General: Awake, alert, no acute distress.  Nontoxic appearing. Head:  Atraumatic, normocephalic.   ENT:  EOM intact, PERRL. Oral mucosa is pink and moist with no lesions. Neck: Neck is supple with full range of motion, No meningeal  signs. Cardiovascular:  RRR, No murmurs. Peripheral pulses palpable and equal bilaterally. Respiratory:  Symmetrical chest wall expansion.  No rhonchi, rales, or wheezes.  Good air movement throughout.  No use of accessory muscles.   Musculoskeletal:  No cyanosis or edema. Moving extremities with full ROM Abdomen:  Soft, nontender, nondistended. Neuro:  GCS 15, moving all four extremities, interacting appropriately. Speech clear. Psych:  Calm, appropriate.   Skin:  Warm, dry, no rash.    ED Results / Procedures / Treatments   Labs (all labs ordered are listed, but only abnormal results are displayed) Labs Reviewed  COMPREHENSIVE METABOLIC PANEL WITH GFR - Abnormal; Notable for the following components:      Result Value   CO2 18 (*)    Glucose, Bld 123 (*)    Total Protein 8.7 (*)    Albumin 5.7 (*)    All other components within normal limits  CBC - Abnormal; Notable for the following components:   WBC 12.6 (*)    All other components within normal limits  LIPASE, BLOOD  URINALYSIS, ROUTINE W REFLEX MICROSCOPIC  EKG    RADIOLOGY    PROCEDURES:  Critical Care performed: No  Procedures   MEDICATIONS ORDERED IN ED: Medications  ondansetron  (ZOFRAN ) injection 4 mg (4 mg Intravenous Given 12/06/23 1847)  sodium chloride  0.9 % bolus 1,000 mL (1,000 mLs Intravenous New Bag/Given 12/06/23 1854)  ketorolac  (TORADOL ) 15 MG/ML injection 15 mg (15 mg Intravenous Given 12/06/23 2018)  droperidol (INAPSINE) 2.5 MG/ML injection 1.25 mg (1.25 mg Intravenous Given 12/06/23 2018)     IMPRESSION / MDM / ASSESSMENT AND PLAN / ED COURSE  I reviewed the triage vital signs and the nursing notes.                              Differential diagnosis includes, but is not limited to, gastroenteritis, colitis, pancreatitis, lower suspicion for acute infection such as appendicitis or cholecystitis  Patient's presentation is most consistent with acute complicated illness / injury requiring  diagnostic workup.  Patient is a 28 year old male presenting today for vomiting, diarrhea, and abdominal pain.  The vomiting and diarrhea preceded the abdominal pain.  On his exam he has no focal tenderness anywhere in his abdomen.  Suspect that his symptoms are likely related to the vomiting and diarrhea.  Vital signs otherwise stable.  Laboratory workup with slight leukocytosis of 12.6.  Lipase normal.  CMP with mild hypocarbia likely from the vomiting but otherwise reassuring.  Patient given 1 L fluids and Zofran  while in the waiting room.  Had some ongoing nausea symptoms and will give Toradol  and droperidol.  Patient was reassessed with resolution of symptoms.  He was able to tolerate p.o. without significant difficulty.  No ongoing focal tenderness anywhere with no indication for CT.  Will discharge for suspected gastroenteritis with Zofran  as needed.  Given strict return precautions and he is agreeable with plan.  Clinical Course as of 12/06/23 2127  Mon Dec 06, 2023  2125 Patient feeling better with no ongoing vomiting and no abdominal pain.  Was able to tolerate liquids.  Will discharge with nausea medication. [DW]    Clinical Course User Index [DW] Kandee Orion, MD     FINAL CLINICAL IMPRESSION(S) / ED DIAGNOSES   Final diagnoses:  Gastroenteritis     Rx / DC Orders   ED Discharge Orders          Ordered    ondansetron  (ZOFRAN ) 4 MG tablet  Every 8 hours PRN        12/06/23 2127             Note:  This document was prepared using Dragon voice recognition software and may include unintentional dictation errors.   Kandee Orion, MD 12/06/23 2128

## 2024-01-18 ENCOUNTER — Ambulatory Visit: Payer: Self-pay

## 2024-02-23 ENCOUNTER — Ambulatory Visit: Payer: Self-pay | Admitting: Nurse Practitioner

## 2024-02-23 ENCOUNTER — Encounter: Payer: Self-pay | Admitting: Nurse Practitioner

## 2024-02-23 ENCOUNTER — Other Ambulatory Visit (HOSPITAL_COMMUNITY)
Admission: RE | Admit: 2024-02-23 | Discharge: 2024-02-23 | Disposition: A | Discharge status: Home / Self Care | Source: Ambulatory Visit | Attending: Nurse Practitioner | Admitting: Nurse Practitioner

## 2024-02-23 VITALS — BP 122/78 | HR 104 | Temp 98.4°F | Resp 16 | Ht 74.75 in | Wt 149.0 lb

## 2024-02-23 DIAGNOSIS — R519 Headache, unspecified: Secondary | ICD-10-CM

## 2024-02-23 DIAGNOSIS — Z113 Encounter for screening for infections with a predominantly sexual mode of transmission: Secondary | ICD-10-CM

## 2024-02-23 DIAGNOSIS — R202 Paresthesia of skin: Secondary | ICD-10-CM

## 2024-02-23 DIAGNOSIS — R200 Anesthesia of skin: Secondary | ICD-10-CM | POA: Insufficient documentation

## 2024-02-23 DIAGNOSIS — Z13220 Encounter for screening for lipoid disorders: Secondary | ICD-10-CM

## 2024-02-23 DIAGNOSIS — Z8679 Personal history of other diseases of the circulatory system: Secondary | ICD-10-CM

## 2024-02-23 DIAGNOSIS — Z131 Encounter for screening for diabetes mellitus: Secondary | ICD-10-CM

## 2024-02-23 DIAGNOSIS — Z130 Encounter for screening for diseases of the blood and blood-forming organs and certain disorders involving the immune mechanism: Secondary | ICD-10-CM

## 2024-02-23 DIAGNOSIS — T8859XA Other complications of anesthesia, initial encounter: Secondary | ICD-10-CM | POA: Insufficient documentation

## 2024-02-23 NOTE — Progress Notes (Signed)
 BP 122/78   Pulse (!) 104   Temp 98.4 F (36.9 C)   Resp 16   Ht 6' 2.75 (1.899 m)   Wt 149 lb (67.6 kg)   SpO2 99%   BMI 18.75 kg/m    Subjective:    Patient ID: Eric Durham, male    DOB: [birth year], 28 y.o.   MRN: [id number]  HPI: Eric Durham is a 28 y.o. male  Chief Complaint  Patient presents with   Establish Care    Discussed the use of AI scribe software for clinical note transcription with the patient, who gave verbal consent to proceed.  History of Present Illness Eric Durham is a 28 year old male who presents with concerns about headaches and a request for STD screening and to establish care.   Cephalalgia and neurological symptoms - Occasional headaches, sometimes waking him from sleep, primarily on the left side of the head - Headaches are not frequent and typically resolve after drinking water - History of headaches at the occiput since adolescence, previously attributed to dehydration or diet, now less frequent - patient had a concern about possible brain tumor due to past headaches and prior neurological symptoms - Experienced tingling in right fingers several months ago, now resolved - No current numbness, tingling, changes in vision, paralysis, or confusion  Sexually transmitted infection exposure and screening - Request for full STD panel due to concern about HPV and cervical cancer following a partner's recent diagnosis  Colorectal cancer screening - Interest in scheduling a colonoscopy due to age - No family history of colorectal cancer - No rectal bleeding  Hypertension and general well-being - Past diagnosis of high blood pressure, not currently on medication - Previously prescribed propranolol for anxiety, discontinued due to distrust in prescribing physician - Currently feels well with good energy levels - Taking vitamins and eating bananas, perceives improvement in overall well-being         02/23/2024    2:49 PM   Depression screen PHQ 2/9  Decreased Interest 0  Down, Depressed, Hopeless 0  PHQ - 2 Score 0  Altered sleeping 0  Tired, decreased energy 0  Change in appetite 0  Feeling bad or failure about yourself  0  Trouble concentrating 0  Moving slowly or fidgety/restless 0  Suicidal thoughts 0  PHQ-9 Score 0  Difficult doing work/chores Not difficult at all       02/23/2024    2:49 PM  GAD 7 : Generalized Anxiety Score  Nervous, Anxious, on Edge 0  Control/stop worrying 0  Worry too much - different things 0  Trouble relaxing 0  Restless 0  Easily annoyed or irritable 0  Afraid - awful might happen 0  Total GAD 7 Score 0  Anxiety Difficulty Not difficult at all     Relevant past medical, surgical, family and social history reviewed and updated as indicated. Interim medical history since our last visit reviewed. Allergies and medications reviewed and updated.  Review of Systems  Ten systems reviewed and is negative except as mentioned in HPI      Objective:     BP 122/78   Pulse (!) 104   Temp 98.4 F (36.9 C)   Resp 16   Ht 6' 2.75 (1.899 m)   Wt 149 lb (67.6 kg)   SpO2 99%   BMI 18.75 kg/m    Wt Readings from Last 3 Encounters:  02/23/24 149 lb (67.6 kg)  12/06/23 160 lb (72.6  kg)  05/20/23 160 lb (72.6 kg)    Physical Exam Physical Exam GENERAL: Alert, cooperative, well developed, no acute distress HEENT: Normocephalic, normal oropharynx, moist mucous membranes CHEST: Clear to auscultation bilaterally, no wheezes, rhonchi, or crackles CARDIOVASCULAR: Normal heart rate and rhythm, S1 and S2 normal without murmurs ABDOMEN: Soft, non-tender, non-distended, without organomegaly, normal bowel sounds EXTREMITIES: No cyanosis or edema NEUROLOGICAL: Cranial nerves grossly intact, moves all extremities without gross motor or sensory deficit, motor function normal, grip strength intact, proprioception normal, no pronator drift, strength 5/5, facial movements  normal   Results for orders placed or performed during the hospital encounter of 12/06/23  Lipase, blood   Collection Time: 12/06/23  6:25 PM  Result Value Ref Range   Lipase 48 11 - 51 U/L  Comprehensive metabolic panel   Collection Time: 12/06/23  6:25 PM  Result Value Ref Range   Sodium 138 135 - 145 mmol/L   Potassium 3.7 3.5 - 5.1 mmol/L   Chloride 106 98 - 111 mmol/L   CO2 18 (L) 22 - 32 mmol/L   Glucose, Bld 123 (H) 70 - 99 mg/dL   BUN 11 6 - 20 mg/dL   Creatinine, Ser 9.18 0.61 - 1.24 mg/dL   Calcium 89.9 8.9 - 89.6 mg/dL   Total Protein 8.7 (H) 6.5 - 8.1 g/dL   Albumin 5.7 (H) 3.5 - 5.0 g/dL   AST 29 15 - 41 U/L   ALT 21 0 - 44 U/L   Alkaline Phosphatase 69 38 - 126 U/L   Total Bilirubin 1.2 0.0 - 1.2 mg/dL   GFR, Estimated >39 >39 mL/min   Anion gap 14 5 - 15  CBC   Collection Time: 12/06/23  6:25 PM  Result Value Ref Range   WBC 12.6 (H) 4.0 - 10.5 K/uL   RBC 5.31 4.22 - 5.81 MIL/uL   Hemoglobin 15.3 13.0 - 17.0 g/dL   HCT 54.5 60.9 - 47.9 %   MCV 85.5 80.0 - 100.0 fL   MCH 28.8 26.0 - 34.0 pg   MCHC 33.7 30.0 - 36.0 g/dL   RDW 87.3 88.4 - 84.4 %   Platelets 284 150 - 400 K/uL   nRBC 0.0 0.0 - 0.2 %          Assessment & Plan:   Problem List Items Addressed This Visit   None Visit Diagnoses       Numbness and tingling in right hand    -  Primary   Relevant Orders   CBC with Differential/Platelet   Comprehensive metabolic panel with GFR   Hepatitis C antibody   VITAMIN D  25 Hydroxy (Vit-D Deficiency, Fractures)   TSH   B12 and Folate Panel   Iron, TIBC and Ferritin Panel   Urine cytology ancillary only     Screening examination for STD (sexually transmitted disease)       Relevant Orders   HSV(herpes simplex vrs) 1+2 ab-IgG   HIV Antibody (routine testing w rflx)   RPR   Urine cytology ancillary only     Screening for diabetes mellitus       Relevant Orders   Comprehensive metabolic panel with GFR   Hemoglobin A1c     Screening for  cholesterol level       Relevant Orders   Lipid panel     Screening for deficiency anemia       Relevant Orders   CBC with Differential/Platelet   Iron, TIBC and Ferritin Panel  Assessment and Plan Assessment & Plan Headaches Intermittent headaches with varying locations, including the left side and the back of the head, occurring during sleep and resolving spontaneously. Differential diagnosis includes tension headaches and migraines. No associated neurological symptoms suggestive of a more serious condition like a brain tumor. Reassurance provided.  History of hypertension Previously diagnosed with hypertension. Propranolol was prescribed for anxiety but discontinued due to distrust in the prescribing physician. Current blood pressure is lower, and he reports feeling fine.  Anxiety Previously managed with propranolol, which was discontinued. Currently experiences situational anxiety, such as when visiting the clinic, but otherwise feels fine. Patient does seem to experience health anxiety.   STD screening Desires STD screening due to concerns about HPV and potential exposure. - Order full STD panel including blood and urine tests.  Numbness and tingling right hand Intermittent numbness and tingling in the right hand, specifically in the last 3 fingers, experienced several months ago. Symptoms have resolved and are not currently present.  General Health Maintenance Discussed colonoscopy screening guidelines, indicating that screening typically starts at age 33 unless there is a family history of colorectal cancer. Discussed prostate cancer screening starting at age 46.        Follow up plan: Return in about 1 year (around 02/22/2025) for cpe.

## 2024-02-24 LAB — COMPREHENSIVE METABOLIC PANEL WITH GFR
AG Ratio: 2.7 (calc) — ABNORMAL HIGH (ref 1.0–2.5)
ALT: 16 U/L (ref 9–46)
AST: 19 U/L (ref 10–40)
Albumin: 5.2 g/dL — ABNORMAL HIGH (ref 3.6–5.1)
Alkaline phosphatase (APISO): 63 U/L (ref 36–130)
BUN: 15 mg/dL (ref 7–25)
CO2: 30 mmol/L (ref 20–32)
Calcium: 9.9 mg/dL (ref 8.6–10.3)
Chloride: 103 mmol/L (ref 98–110)
Creat: 1.04 mg/dL (ref 0.60–1.24)
Globulin: 1.9 g/dL (ref 1.9–3.7)
Glucose, Bld: 82 mg/dL (ref 65–99)
Potassium: 4.7 mmol/L (ref 3.5–5.3)
Sodium: 139 mmol/L (ref 135–146)
Total Bilirubin: 0.5 mg/dL (ref 0.2–1.2)
Total Protein: 7.1 g/dL (ref 6.1–8.1)
eGFR: 100 mL/min/1.73m2 (ref >=60)

## 2024-02-24 LAB — HSV(HERPES SIMPLEX VRS) I + II AB-IGG
HSV 1 IGG,TYPE SPECIFIC AB: <0.9 {index}
HSV 2 IGG,TYPE SPECIFIC AB: <0.9 {index}

## 2024-02-24 LAB — CBC WITH DIFFERENTIAL/PLATELET
Absolute Lymphocytes: 1992 {cells}/uL (ref 850–3900)
Absolute Monocytes: 468 {cells}/uL (ref 200–950)
Basophils Absolute: 52 {cells}/uL (ref 0–200)
Basophils Relative: 1 %
Eosinophils Absolute: 52 {cells}/uL (ref 15–500)
Eosinophils Relative: 1 %
HCT: 42.8 % (ref 38.5–50.0)
Hemoglobin: 13.5 g/dL (ref 13.2–17.1)
MCH: 28.2 pg (ref 27.0–33.0)
MCHC: 31.5 g/dL — ABNORMAL LOW (ref 32.0–36.0)
MCV: 89.4 fL (ref 80.0–100.0)
MPV: 11.2 fL (ref 7.5–12.5)
Monocytes Relative: 9 %
Neutro Abs: 2636 {cells}/uL (ref 1500–7800)
Neutrophils Relative %: 50.7 %
Platelets: 251 Thousand/uL (ref 140–400)
RBC: 4.79 Million/uL (ref 4.20–5.80)
RDW: 12.7 % (ref 11.0–15.0)
Total Lymphocyte: 38.3 %
WBC: 5.2 Thousand/uL (ref 3.8–10.8)

## 2024-02-24 LAB — IRON,TIBC AND FERRITIN PANEL
%SAT: 28 % (ref 20–48)
Ferritin: 59 ng/mL (ref 38–380)
Iron: 92 ug/dL (ref 50–195)
TIBC: 331 ug/dL (ref 250–425)

## 2024-02-24 LAB — B12 AND FOLATE PANEL
Folate: >24 ng/mL
Vitamin B-12: 575 pg/mL (ref 200–1100)

## 2024-02-24 LAB — HIV ANTIBODY (ROUTINE TESTING W REFLEX): HIV 1&2 Ab, 4th Generation: NONREACTIVE

## 2024-02-24 LAB — HEPATITIS C ANTIBODY: Hepatitis C Ab: NONREACTIVE

## 2024-02-24 LAB — HEMOGLOBIN A1C
Hgb A1c MFr Bld: 5.3 % (ref <5.7)
Mean Plasma Glucose: 105 mg/dL
eAG (mmol/L): 5.8 mmol/L

## 2024-02-24 LAB — RPR: RPR Ser Ql: NONREACTIVE

## 2024-02-24 LAB — LIPID PANEL
Cholesterol: 146 mg/dL (ref <200)
HDL: 55 mg/dL (ref >=40)
LDL Cholesterol (Calc): 73 mg/dL
Non-HDL Cholesterol (Calc): 91 mg/dL (ref <130)
Total CHOL/HDL Ratio: 2.7 (calc) (ref <5.0)
Triglycerides: 97 mg/dL (ref <150)

## 2024-02-24 LAB — VITAMIN D 25 HYDROXY (VIT D DEFICIENCY, FRACTURES): Vit D, 25-Hydroxy: 27 ng/mL — ABNORMAL LOW (ref 30–100)

## 2024-02-24 LAB — TSH: TSH: 1.46 m[IU]/L (ref 0.40–4.50)

## 2024-02-25 ENCOUNTER — Ambulatory Visit: Payer: Self-pay | Admitting: Nurse Practitioner

## 2024-02-25 LAB — URINE CYTOLOGY ANCILLARY ONLY
Chlamydia: NEGATIVE
Comment: NEGATIVE
Comment: NEGATIVE
Comment: NORMAL
Neisseria Gonorrhea: NEGATIVE
Trichomonas: NEGATIVE

## 2024-03-07 ENCOUNTER — Ambulatory Visit: Payer: Self-pay

## 2024-06-03 DIAGNOSIS — H6123 Impacted cerumen, bilateral: Secondary | ICD-10-CM | POA: Diagnosis not present

## 2025-02-23 ENCOUNTER — Encounter: Admitting: Nurse Practitioner
